# Patient Record
Sex: Male | Born: 1950 | Race: Black or African American | Hispanic: No | Marital: Married
Health system: Midwestern US, Community
[De-identification: ages and names within clinical notes are randomized; demographics above are authoritative.]

## PROBLEM LIST (undated history)

## (undated) DIAGNOSIS — I10 Essential (primary) hypertension: Secondary | ICD-10-CM

## (undated) DIAGNOSIS — R030 Elevated blood-pressure reading, without diagnosis of hypertension: Secondary | ICD-10-CM

## (undated) DIAGNOSIS — IMO0001 Reserved for inherently not codable concepts without codable children: Secondary | ICD-10-CM

## (undated) DIAGNOSIS — K635 Polyp of colon: Secondary | ICD-10-CM

## (undated) DIAGNOSIS — E785 Hyperlipidemia, unspecified: Secondary | ICD-10-CM

## (undated) DIAGNOSIS — R079 Chest pain, unspecified: Secondary | ICD-10-CM

## (undated) HISTORY — DX: Reserved for inherently not codable concepts without codable children: IMO0001

## (undated) HISTORY — DX: Chest pain, unspecified: R07.9

## (undated) HISTORY — DX: Hypercalcemia: E83.52

## (undated) HISTORY — DX: Elevated blood-pressure reading, without diagnosis of hypertension: R03.0

## (undated) HISTORY — DX: Hyperlipidemia, unspecified: E78.5

## (undated) HISTORY — DX: Polyp of colon: K63.5

## (undated) NOTE — Telephone Encounter (Signed)
 Formatting of this note might be different from the original. RX declined.  Pt relocated out of town Electronically signed by Tonnie Pao at 04/09/2022  6:35 AM EDT

---

## 2001-07-10 ENCOUNTER — Encounter: Payer: Self-pay | Admitting: Emergency Medicine

## 2001-07-10 ENCOUNTER — Emergency Department (HOSPITAL_COMMUNITY): Admission: EM | Admit: 2001-07-10 | Discharge: 2001-07-10 | Payer: Self-pay | Admitting: Emergency Medicine

## 2003-02-28 ENCOUNTER — Ambulatory Visit (HOSPITAL_COMMUNITY): Admission: RE | Admit: 2003-02-28 | Discharge: 2003-02-28 | Payer: Self-pay | Admitting: Infectious Diseases

## 2003-02-28 ENCOUNTER — Encounter: Payer: Self-pay | Admitting: Infectious Diseases

## 2008-12-07 HISTORY — PX: COLONOSCOPY: SHX174

## 2009-06-20 ENCOUNTER — Encounter (INDEPENDENT_AMBULATORY_CARE_PROVIDER_SITE_OTHER): Payer: Self-pay | Admitting: *Deleted

## 2009-07-16 ENCOUNTER — Ambulatory Visit: Payer: Self-pay | Admitting: Internal Medicine

## 2009-07-16 DIAGNOSIS — N400 Enlarged prostate without lower urinary tract symptoms: Secondary | ICD-10-CM

## 2009-07-26 ENCOUNTER — Ambulatory Visit: Payer: Self-pay | Admitting: Internal Medicine

## 2009-08-09 ENCOUNTER — Encounter (INDEPENDENT_AMBULATORY_CARE_PROVIDER_SITE_OTHER): Payer: Self-pay | Admitting: *Deleted

## 2009-08-13 LAB — CONVERTED CEMR LAB
ALT: 30 units/L (ref 0–53)
AST: 22 units/L (ref 0–37)
Albumin: 4.4 g/dL (ref 3.5–5.2)
Alkaline Phosphatase: 58 units/L (ref 39–117)
BUN: 16 mg/dL (ref 6–23)
Basophils Absolute: 0 10*3/uL (ref 0.0–0.1)
Basophils Relative: 0.5 % (ref 0.0–3.0)
Bilirubin, Direct: 0 mg/dL (ref 0.0–0.3)
CO2: 31 meq/L (ref 19–32)
Calcium: 9.5 mg/dL (ref 8.4–10.5)
Chloride: 109 meq/L (ref 96–112)
Cholesterol: 218 mg/dL — ABNORMAL HIGH (ref 0–200)
Creatinine, Ser: 1 mg/dL (ref 0.4–1.5)
Direct LDL: 164.7 mg/dL
Eosinophils Absolute: 0.1 10*3/uL (ref 0.0–0.7)
Eosinophils Relative: 1.9 % (ref 0.0–5.0)
GFR calc non Af Amer: 98.44 mL/min (ref >60)
Glucose, Bld: 94 mg/dL (ref 70–99)
HCT: 42.7 % (ref 39.0–52.0)
HDL: 43 mg/dL (ref >39.00)
Hemoglobin: 14.6 g/dL (ref 13.0–17.0)
Lymphocytes Relative: 36.3 % (ref 12.0–46.0)
Lymphs Abs: 2.4 10*3/uL (ref 0.7–4.0)
MCHC: 34.2 g/dL (ref 30.0–36.0)
MCV: 86.8 fL (ref 78.0–100.0)
Monocytes Absolute: 0.4 10*3/uL (ref 0.1–1.0)
Monocytes Relative: 6.4 % (ref 3.0–12.0)
Neutro Abs: 3.6 10*3/uL (ref 1.4–7.7)
Neutrophils Relative %: 54.9 % (ref 43.0–77.0)
PSA: 0.33 ng/mL (ref 0.10–4.00)
Platelets: 253 10*3/uL (ref 150.0–400.0)
Potassium: 3.8 meq/L (ref 3.5–5.1)
RBC: 4.92 M/uL (ref 4.22–5.81)
RDW: 12.9 % (ref 11.5–14.6)
Sodium: 144 meq/L (ref 135–145)
TSH: 1.53 microintl units/mL (ref 0.35–5.50)
Total Bilirubin: 0.8 mg/dL (ref 0.3–1.2)
Total CHOL/HDL Ratio: 5
Total Protein: 7.6 g/dL (ref 6.0–8.3)
Triglycerides: 73 mg/dL (ref 0.0–149.0)
VLDL: 14.6 mg/dL (ref 0.0–40.0)
WBC: 6.5 10*3/uL (ref 4.5–10.5)

## 2009-08-27 ENCOUNTER — Ambulatory Visit: Payer: Self-pay | Admitting: Internal Medicine

## 2009-09-10 ENCOUNTER — Ambulatory Visit: Payer: Self-pay | Admitting: Internal Medicine

## 2009-09-10 ENCOUNTER — Encounter: Payer: Self-pay | Admitting: Internal Medicine

## 2009-09-11 ENCOUNTER — Encounter: Payer: Self-pay | Admitting: Internal Medicine

## 2011-03-24 ENCOUNTER — Encounter: Payer: Self-pay | Admitting: Internal Medicine

## 2011-03-24 ENCOUNTER — Ambulatory Visit (INDEPENDENT_AMBULATORY_CARE_PROVIDER_SITE_OTHER): Payer: BLUE CROSS/BLUE SHIELD | Admitting: Internal Medicine

## 2011-03-24 VITALS — BP 116/72 | HR 60 | Temp 98.6°F | Wt 164.6 lb

## 2011-03-24 DIAGNOSIS — K4090 Unilateral inguinal hernia, without obstruction or gangrene, not specified as recurrent: Secondary | ICD-10-CM

## 2011-03-24 NOTE — Patient Instructions (Signed)
Please take this note to the Surgeon.

## 2011-03-24 NOTE — Progress Notes (Signed)
  Subjective:    Patient ID: Max Perez, male    DOB: [birth year], 60 y.o.   MRN: [id number]  HPI he noted a knot in the right inguinal area slightly over 3 weeks ago. It was not tender, swollen, or red.  He has no constitutional symptoms of fever, chills, sweats, or weight loss. He's had no abdominal  pain, dysuria , hematuria, or pyuria.  He denies any abnormal bruising or bleeding. He  has  no enlarged lymph nodes.  His job does  not involve  heavy lifting, but he is involved in karate.      Review of Systems     Objective:   Physical Exam he is healthy appearing &  well-nourished.  Abdomen reveals no organomegaly, masses, or tenderness.  He has no lymphadenopathy about the head, neck, or inguinal areas.  Skin reveals no significant lesions.  The genitalia revealed no significant lesions.  He has an inguinal  hernia on the right which is reducible.          Assessment & Plan:  #1 reducible hernia right inguinal area. He is engaged in karate which could possibly exacerbate the hernia  Plan: #1 Gen. surgery referral to review risk and options.

## 2011-03-25 ENCOUNTER — Encounter: Payer: Self-pay | Admitting: Internal Medicine

## 2011-04-15 ENCOUNTER — Encounter (HOSPITAL_COMMUNITY)
Admission: RE | Admit: 2011-04-15 | Discharge: 2011-04-15 | Disposition: A | Discharge status: Home / Self Care | Payer: BC Managed Care – PPO | Source: Ambulatory Visit | Attending: General Surgery | Admitting: General Surgery

## 2011-04-15 LAB — DIFFERENTIAL
Basophils Absolute: 0 10*3/uL (ref 0.0–0.1)
Basophils Relative: 0 % (ref 0–1)
Eosinophils Absolute: 0.1 10*3/uL (ref 0.0–0.7)
Eosinophils Relative: 1 % (ref 0–5)
Lymphocytes Relative: 29 % (ref 12–46)
Lymphs Abs: 2.4 10*3/uL (ref 0.7–4.0)
Monocytes Absolute: 0.5 10*3/uL (ref 0.1–1.0)
Monocytes Relative: 6 % (ref 3–12)
Neutro Abs: 5.1 10*3/uL (ref 1.7–7.7)
Neutrophils Relative %: 63 % (ref 43–77)

## 2011-04-15 LAB — BASIC METABOLIC PANEL
BUN: 20 mg/dL (ref 6–23)
Calcium: 10.6 mg/dL — ABNORMAL HIGH (ref 8.4–10.5)
Creatinine, Ser: 0.92 mg/dL (ref 0.4–1.5)
GFR calc non Af Amer: >60 mL/min (ref >60)
Glucose, Bld: 85 mg/dL (ref 70–99)
Sodium: 142 mEq/L (ref 135–145)

## 2011-04-15 LAB — CBC
HCT: 41.9 % (ref 39.0–52.0)
Hemoglobin: 14.6 g/dL (ref 13.0–17.0)
MCH: 29.2 pg (ref 26.0–34.0)
MCHC: 34.8 g/dL (ref 30.0–36.0)
MCV: 83.8 fL (ref 78.0–100.0)
Platelets: 267 10*3/uL (ref 150–400)
RBC: 5 MIL/uL (ref 4.22–5.81)
RDW: 12.9 % (ref 11.5–15.5)
WBC: 8.1 10*3/uL (ref 4.0–10.5)

## 2011-04-20 ENCOUNTER — Ambulatory Visit (HOSPITAL_COMMUNITY)
Admission: RE | Admit: 2011-04-20 | Discharge: 2011-04-21 | Disposition: A | Discharge status: Home / Self Care | Payer: BC Managed Care – PPO | Source: Ambulatory Visit | Attending: General Surgery | Admitting: General Surgery

## 2011-04-20 DIAGNOSIS — Z01812 Encounter for preprocedural laboratory examination: Secondary | ICD-10-CM | POA: Insufficient documentation

## 2011-04-20 DIAGNOSIS — Z01810 Encounter for preprocedural cardiovascular examination: Secondary | ICD-10-CM | POA: Insufficient documentation

## 2011-04-20 DIAGNOSIS — K4090 Unilateral inguinal hernia, without obstruction or gangrene, not specified as recurrent: Secondary | ICD-10-CM | POA: Insufficient documentation

## 2011-05-01 NOTE — Op Note (Signed)
NAME:  Max Perez, Max Perez NO.:  [id number]  MEDICAL RECORD NO.:  [id number]           PATIENT TYPE:  O  LOCATION:  5149                         FACILITY:  [facility]  PHYSICIAN:  Mary Sella. Andrey Campanile, MD     DATE OF BIRTH:  May 24, 1951  DATE OF PROCEDURE:  04/20/2011 DATE OF DISCHARGE:                              OPERATIVE REPORT   PREOPERATIVE DIAGNOSIS:  Right inguinal hernia.  POSTOPERATIVE DIAGNOSIS:  Right direct inguinal hernia.  PROCEDURE:  Laparoscopic repair of right direct inguinal hernia with mesh (TAPP).  SURGEON:  Mary Sella. Andrey Campanile, MD  ASSISTANT:  None.  ANESTHESIA:  General plus 20 mL of 0.25% Marcaine with epinephrine.  FINDINGS:  The patient only had a right direct inguinal hernia.  There is no evidence of any other groin hernia.  We used a 4 inches x 6 inches piece of Physiomesh.  INDICATIONS FOR PROCEDURE:  The patient is a 60 year old African American male who noticed a bulge in his groin beginning in April.  It was more noticeable during the day as he did his activities of daily living.  He went to his primary care doctor who diagnosed hernia.  We confirmed the diagnosis in the office.  We discussed the risks, benefits of surgery including bleeding, infection, injury to surrounding structures, testicular loss, chronic inguinal pain, DVT occurrence, urinary retention, hernia recurrence, and possible need to convert to an open procedure.  He elected to proceed to the operating room.  DESCRIPTION OF PROCEDURE:  Before going back to the operating room, the patient's right groin was marked in the holding bay with the patient confirming the operative site.  He was then taken back to the operating room where general endotracheal anesthesia was established.  A Foley catheter was placed.  Sequential compression devices were placed.  His abdomen was prepped and draped in usual standard surgical fashion.  He received antibiotics prior to skin incision.  A  surgical time-out was performed.  Local was infiltrated at the base of the umbilicus.  Next, a vertical 1-inch infraumbilical incision was made with a #11 blade.  The fascia was grasped and lifted anteriorly.  Next, the fascia was incised with a #11 blade and the abdominal cavity was entered.  A pursestring suture consisting of 0 Vicryl and UR-6 needle was placed around the fascial edges.  The Hasson trocar was introduced directly into the abdominal cavity.  Pneumoperitoneum was smoothly established up to the patient pressure of 15 mmHg.  Laparoscope was advanced, and he placed in slight Trendelenburg position.  The right groin was inspected.  He had evidence of a direct hernia since it was medial to the inferior epigastric vessels in the right.  In the left groin, there was no hernia defect at all.  I then placed two 5-mm trocars, one in the right, one in the left midclavicular line at the level of the umbilicus, all under direct visualization after local  had been infiltrated.  I then grasped the peritoneum several inches above the right anterior and superior iliac spine with the snuff-nose grasper then incised it with EndoShears with electrocautery.  I then extended the  incision medially in a lazy-S configuration towards the median umbilical ligament.  I then dissected the peritoneal flap downward away from the abdominal wall taking care not to injure the inferior epigastric vessel.  He is a thin gentleman and his anatomy was nice.  Identified the testicular vessels as well as the vas deferens.  I then continued to strip and take down the loose areolar tissues.  The pubic tubercle was identified.  Once I had a nice flap dissected downward, I then reduced the direct defect.  There is a little bit of fat in this area, and I stripped it, and used the interstitial electrocautery to separate it, and some of the fat was excised and brought out from the abdominal cavity and discarded.   We reduced the direct hernia in its entirety stripping away the peritoneum away from the direct defect.  I then continued to make my flap posteriorly down toward the iliacs taking care not to injure the iliacs or the vas or the testicular vessels.  I had a nice pocket at this point.  I reconfirmed my anatomical landmarks being the pubic tubercle, the inferior epigastric vessels, vas deferens, and testicular vessels. I then obtained a piece of 4 inches x 6 inches piece of Physiomesh, placed it through the Hasson trocar and placed it in the right groin. It is oriented and slightly oblique, so that the midpoint hash was directly over the inferior epigastric vessels.  Prior to doing this, I did grasp the peritoneum in the direct defect, and brought it back into the abdominal cavity and tacked it to [employer] with a secure strap tacker then laid the mesh back out so that happened was covering the direct defect and half covering laterally to the inferior epigastric vessels.  I then tacked the mesh in a sequential manner starting medially in Cooper ligament into the anterior abdominal wall then placing one tack on each side of the inferior gastric vessels, and one tack out laterally.  There was no tack placed below the shelving edge. We then reduced the intra-abdominal pressure down to 8 mmHg.  I then brought the peritoneal flap intact back up to the abdominal wall covering the mesh ensuring that there was no tack placed through the inferior epigastric vessel.  Again, no tack was placed below the inguinal ligament.  There was a small rent that had been made in the peritoneal flap.  I was able to puncture back together and placed the tack through it into the right lateral abdominal wall.  The mesh was well covered.  I removed this on trocar and tied down the previously placed pursestring suture.  The fascial defect was obliterated, but I felt may be 3 mm or 4 mm opening at the apex of the  umbilical incision, therefore, I placed a single interrupted 0 Vicryl and tied it down.  I was more satisfied with my fascial closure.  There was nothing within our fascial closure when viewed laparoscopically.  Pneumoperitoneum was released.  Remaining trocars were removed.  Skin incisions were closed with a 4-0 Monocryl followed by the application of Dermabond.  There are no immediate complications.  Foley catheter was removed.  The patient was extubated and taken to recovery room in stable addition.  All needle and sponge counts were correct x2.     Mary Sella. Andrey Campanile, MD     EMW/MEDQ  D:  04/20/2011  T:  04/21/2011  Job:  [id number]  cc:   Titus Dubin. Alwyn Ren, MD,FACP,FCCP  Electronically Signed by Gaynelle Adu M.D. on 05/01/2011 09:15:29 AM

## 2011-05-06 ENCOUNTER — Encounter (INDEPENDENT_AMBULATORY_CARE_PROVIDER_SITE_OTHER): Payer: Self-pay | Admitting: General Surgery

## 2014-07-31 ENCOUNTER — Encounter: Payer: Self-pay | Admitting: Internal Medicine

## 2014-09-26 ENCOUNTER — Ambulatory Visit (INDEPENDENT_AMBULATORY_CARE_PROVIDER_SITE_OTHER): Payer: BC Managed Care – PPO | Admitting: Internal Medicine

## 2014-09-26 ENCOUNTER — Encounter: Payer: Self-pay | Admitting: Internal Medicine

## 2014-09-26 VITALS — BP 156/92 | HR 58 | Temp 98.6°F | Resp 13 | Wt 179.1 lb

## 2014-09-26 DIAGNOSIS — Z0189 Encounter for other specified special examinations: Secondary | ICD-10-CM

## 2014-09-26 DIAGNOSIS — Z0000 Encounter for general adult medical examination without abnormal findings: Secondary | ICD-10-CM

## 2014-09-26 DIAGNOSIS — E785 Hyperlipidemia, unspecified: Secondary | ICD-10-CM | POA: Insufficient documentation

## 2014-09-26 DIAGNOSIS — K635 Polyp of colon: Secondary | ICD-10-CM | POA: Insufficient documentation

## 2014-09-26 DIAGNOSIS — N400 Enlarged prostate without lower urinary tract symptoms: Secondary | ICD-10-CM

## 2014-09-26 DIAGNOSIS — R030 Elevated blood-pressure reading, without diagnosis of hypertension: Secondary | ICD-10-CM

## 2014-09-26 NOTE — Progress Notes (Signed)
Pre visit review using our clinic review tool, if applicable. No additional management support is needed unless otherwise documented below in the visit note. 

## 2014-09-26 NOTE — Progress Notes (Signed)
   Subjective:    Patient ID: Max Perez, male    DOB: [birth year], [age] y.o.   MRN: [id number]  HPI  He is here for a physical;acute issues denied.  He is not on a heart healthy diet but does restrict salt. He states that he monitors his blood pressure at the pharmacy. He does not have the recordings with him  He does exercise 5 days/ week without cardiopulmonary symptoms  His last lipids were in 2010; LDL was 164.7. There is no family history of premature heart attack or stroke.  He's had history of hyperplastic polyp in 2010. He has no active GI symptoms.      Review of Systems   Chest pain, palpitations, tachycardia, exertional dyspnea, paroxysmal nocturnal dyspnea, claudication or edema are absent.  Unexplained weight loss, abdominal pain, significant dyspepsia, dysphagia, melena, rectal bleeding, or persistently small caliber stools are denied.     Objective:   Physical Exam Gen.: Healthy and well-nourished in appearance. Alert, appropriate and cooperative throughout exam. Appears younger than stated age  Head: Normocephalic without obvious abnormalities; pattern alopecia  Eyes: No corneal or conjunctival inflammation noted. Pupils equal round reactive to light and accommodation. Extraocular motion slightly decreased laterally. Ptosis. Arcus Ears: External  ear exam reveals no significant lesions or deformities. Canals clear .TMs normal. Hearing is grossly normal bilaterally. Nose: External nasal exam reveals no deformity or inflammation. Nasal mucosa are pink and moist. No lesions or exudates noted.   Mouth: Oral mucosa and oropharynx reveal no lesions or exudates. Teeth in fair repair. Neck: No deformities, masses, or tenderness noted. Range of motion & Thyroid normal Lungs: Normal respiratory effort; chest expands symmetrically. Lungs are clear to auscultation without rales, wheezes, or increased work of breathing. Heart: Normal rate and rhythm. Accentuated S1 and S2. No  gallop, click, or rub. No murmur. Abdomen: Bowel sounds normal; abdomen soft and nontender. No masses, organomegaly or hernias noted. Genitalia: Genitalia normal except for left varices. Prostate is upper limits of normal to minimally enlarged w/o asymmetry, nodularity, or induration                           Musculoskeletal/extremities: No deformity or scoliosis noted of  the thoracic or lumbar spine. No clubbing, cyanosis, edema, or significant extremity  deformity noted. Range of motion normal .Tone & strength normal. Hand joints normal Fingernail health good. Able to lie down & sit up w/o help. Negative SLR bilaterally Vascular: Carotid, radial artery, dorsalis pedis and  posterior tibial pulses are full and equal. No bruits present. Neurologic: Alert and oriented x3. Deep tendon reflexes symmetrical and normal.  Gait normal .      Skin: Intact without suspicious lesions or rashes. Lymph: No cervical, axillary, or inguinal lymphadenopathy present. Psych: Mood and affect are normal. Normally interactive                                                                                        Assessment & Plan:  #1 comprehensive physical exam; no acute findings  Plan: see Orders  & Recommendations

## 2014-09-26 NOTE — Patient Instructions (Signed)
Minimal Blood Pressure Goal= AVERAGE < 140/90;  Ideal is an AVERAGE < 135/85. This AVERAGE should be calculated from @ least 5-7 BP readings taken @ different times of day on different days of week. You should not respond to isolated BP readings , but rather the AVERAGE for that week .Please bring your  blood pressure cuff to office visits to verify that it is reliable.It  can also be checked against the blood pressure device at the pharmacy. Finger or wrist cuffs are not dependable; an arm cuff is.  Your next office appointment will be determined based upon review of your pending labs . Those instructions will be transmitted to you through  by mail

## 2014-09-27 ENCOUNTER — Other Ambulatory Visit: Payer: Self-pay | Admitting: Internal Medicine

## 2014-09-27 ENCOUNTER — Other Ambulatory Visit (INDEPENDENT_AMBULATORY_CARE_PROVIDER_SITE_OTHER): Payer: BC Managed Care – PPO

## 2014-09-27 DIAGNOSIS — Z0189 Encounter for other specified special examinations: Secondary | ICD-10-CM

## 2014-09-27 DIAGNOSIS — Z0000 Encounter for general adult medical examination without abnormal findings: Secondary | ICD-10-CM

## 2014-09-27 LAB — CBC WITH DIFFERENTIAL/PLATELET
BASOS PCT: 0.3 % (ref 0.0–3.0)
Basophils Absolute: 0 10*3/uL (ref 0.0–0.1)
EOS ABS: 0.1 10*3/uL (ref 0.0–0.7)
EOS PCT: 1.7 % (ref 0.0–5.0)
HEMATOCRIT: 44.5 % (ref 39.0–52.0)
HEMOGLOBIN: 14.9 g/dL (ref 13.0–17.0)
Lymphocytes Relative: 29.1 % (ref 12.0–46.0)
Lymphs Abs: 1.9 10*3/uL (ref 0.7–4.0)
MCHC: 33.5 g/dL (ref 30.0–36.0)
MCV: 85.1 fl (ref 78.0–100.0)
MONO ABS: 0.4 10*3/uL (ref 0.1–1.0)
Monocytes Relative: 5.9 % (ref 3.0–12.0)
NEUTROS ABS: 4.2 10*3/uL (ref 1.4–7.7)
NEUTROS PCT: 63 % (ref 43.0–77.0)
Platelets: 283 10*3/uL (ref 150.0–400.0)
RBC: 5.23 Mil/uL (ref 4.22–5.81)
RDW: 13.7 % (ref 11.5–15.5)
WBC: 6.7 10*3/uL (ref 4.0–10.5)

## 2014-09-27 LAB — BASIC METABOLIC PANEL
BUN: 19 mg/dL (ref 6–23)
CHLORIDE: 107 meq/L (ref 96–112)
CO2: 25 meq/L (ref 19–32)
Calcium: 9.7 mg/dL (ref 8.4–10.5)
Creatinine, Ser: 1.2 mg/dL (ref 0.4–1.5)
GFR: 77.66 mL/min (ref >60.00)
Glucose, Bld: 91 mg/dL (ref 70–99)
POTASSIUM: 4.3 meq/L (ref 3.5–5.1)
SODIUM: 142 meq/L (ref 135–145)

## 2014-09-27 LAB — HEPATIC FUNCTION PANEL
ALBUMIN: 3.9 g/dL (ref 3.5–5.2)
ALT: 31 U/L (ref 0–53)
AST: 25 U/L (ref 0–37)
Alkaline Phosphatase: 64 U/L (ref 39–117)
BILIRUBIN DIRECT: 0.1 mg/dL (ref 0.0–0.3)
TOTAL PROTEIN: 8 g/dL (ref 6.0–8.3)
Total Bilirubin: 0.7 mg/dL (ref 0.2–1.2)

## 2014-09-27 LAB — TSH: TSH: 0.93 u[IU]/mL (ref 0.35–4.50)

## 2014-09-27 LAB — PSA: PSA: 0.38 ng/mL (ref 0.10–4.00)

## 2014-09-30 ENCOUNTER — Encounter: Payer: Self-pay | Admitting: Internal Medicine

## 2014-10-01 LAB — NMR LIPOPROFILE WITH LIPIDS
CHOLESTEROL, TOTAL: 199 mg/dL (ref 100–199)
HDL Particle Number: 31.1 umol/L (ref >=30.5)
HDL SIZE: 8.4 nm — AB (ref >=9.2)
HDL-C: 45 mg/dL (ref >39)
LARGE HDL: 3.6 umol/L — AB (ref >=4.8)
LARGE VLDL-P: 2 nmol/L (ref <=2.7)
LDL (calc): 141 mg/dL — ABNORMAL HIGH (ref 0–99)
LDL Particle Number: 1754 nmol/L — ABNORMAL HIGH (ref <1000)
LDL Size: 20.6 nm (ref >=20.8)
LP-IR Score: 80 — ABNORMAL HIGH (ref <=45)
SMALL LDL PARTICLE NUMBER: 812 nmol/L — AB (ref <=527)
TRIGLYCERIDES: 63 mg/dL (ref 0–149)
VLDL Size: 62.6 nm — ABNORMAL HIGH (ref <=46.6)

## 2016-01-27 ENCOUNTER — Encounter

## 2016-01-27 ENCOUNTER — Emergency Department: Admit: 2016-01-27

## 2016-01-27 ENCOUNTER — Inpatient Hospital Stay: Admit: 2016-01-27 | Discharge: 2016-01-27 | Disposition: A | Discharge status: Home / Self Care

## 2016-01-27 DIAGNOSIS — S6992XA Unspecified injury of left wrist, hand and finger(s), initial encounter: Secondary | ICD-10-CM

## 2016-01-27 MED ORDER — HYDROCODONE-ACETAMINOPHEN 5-325 MG PO TABS
5-325 MG | ORAL_TABLET | Freq: Three times a day (TID) | ORAL | 0 refills | Status: AC | PRN
Start: 2016-01-27 — End: ?

## 2016-01-27 MED ORDER — HYDROCODONE-ACETAMINOPHEN 5-325 MG PO TABS
5-325 MG | Freq: Once | ORAL | Status: AC
Start: 2016-01-27 — End: 2016-01-27
  Administered 2016-01-27: 20:00:00 1 via ORAL

## 2016-01-27 MED FILL — HYDROCODONE-ACETAMINOPHEN 5-325 MG PO TABS: 5-325 MG | ORAL | Qty: 1

## 2016-01-27 NOTE — ED Notes (Signed)
Pt left wrist cleansed with Hibiclens/Normal Saline applied with gauze/kling/4in strapping ace wrap.     Karie Soda, LPN  11/91/47 [phone]

## 2016-01-27 NOTE — ED Notes (Signed)
Pt instructed on how to use Incentive Spirometer. Pt verbalized understanding.     Karie Soda, LPN  19/14/78 2956
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
## 2016-01-27 NOTE — ED Notes (Signed)
Pt scripts x1 instructed to follow up with [facility]. Assessed per Kendell Bane NP.     Karie Soda, LPN  16/10/96 0454

## 2018-05-25 LAB — HM COLONOSCOPY

## 2019-04-02 ENCOUNTER — Encounter: Payer: Self-pay | Admitting: Internal Medicine

## 2021-07-08 ENCOUNTER — Emergency Department (HOSPITAL_COMMUNITY): Payer: Medicare PPO

## 2021-07-08 ENCOUNTER — Emergency Department (HOSPITAL_COMMUNITY)
Admission: EM | Admit: 2021-07-08 | Discharge: 2021-07-08 | Disposition: A | Discharge status: Home / Self Care | Payer: Medicare PPO | Attending: Emergency Medicine | Admitting: Emergency Medicine

## 2021-07-08 ENCOUNTER — Encounter (HOSPITAL_COMMUNITY): Payer: Self-pay | Admitting: Emergency Medicine

## 2021-07-08 DIAGNOSIS — R079 Chest pain, unspecified: Secondary | ICD-10-CM | POA: Diagnosis present

## 2021-07-08 DIAGNOSIS — R0789 Other chest pain: Secondary | ICD-10-CM | POA: Diagnosis not present

## 2021-07-08 DIAGNOSIS — Z79899 Other long term (current) drug therapy: Secondary | ICD-10-CM | POA: Diagnosis not present

## 2021-07-08 DIAGNOSIS — I10 Essential (primary) hypertension: Secondary | ICD-10-CM | POA: Diagnosis not present

## 2021-07-08 LAB — BASIC METABOLIC PANEL
Anion gap: 7 (ref 5–15)
BUN: 18 mg/dL (ref 8–23)
CO2: 27 mmol/L (ref 22–32)
Calcium: 10 mg/dL (ref 8.9–10.3)
Chloride: 107 mmol/L (ref 98–111)
Creatinine, Ser: 1.24 mg/dL (ref 0.61–1.24)
GFR, Estimated: >60 mL/min (ref >60)
Glucose, Bld: 94 mg/dL (ref 70–99)
Potassium: 4.1 mmol/L (ref 3.5–5.1)
Sodium: 141 mmol/L (ref 135–145)

## 2021-07-08 LAB — TROPONIN I (HIGH SENSITIVITY)
Troponin I (High Sensitivity): 6 ng/L (ref <18)
Troponin I (High Sensitivity): 5 ng/L (ref <18)

## 2021-07-08 LAB — CBC
HCT: 44 % (ref 39.0–52.0)
Hemoglobin: 14.6 g/dL (ref 13.0–17.0)
MCH: 29.3 pg (ref 26.0–34.0)
MCHC: 33.2 g/dL (ref 30.0–36.0)
MCV: 88.2 fL (ref 80.0–100.0)
Platelets: 266 10*3/uL (ref 150–400)
RBC: 4.99 MIL/uL (ref 4.22–5.81)
RDW: 13.1 % (ref 11.5–15.5)
WBC: 5.4 10*3/uL (ref 4.0–10.5)
nRBC: 0 % (ref 0.0–0.2)

## 2021-07-08 MED ORDER — IOHEXOL 350 MG/ML SOLN
75.0000 mL | Freq: Once | INTRAVENOUS | Status: AC | PRN
  Administered 2021-07-08: 75 mL via INTRAVENOUS

## 2021-07-08 NOTE — ED Provider Notes (Signed)
[facility] EMERGENCY DEPARTMENT Provider Note   CSN: [id number] Arrival date & time: 07/08/21  3491     History CC: Left chest pain   Max Perez is a 70 y.o. male presented emergency department with left-sided chest pain.  The patient reports that he was diagnosed with COVID on 18 July.  He had cough, congestion, mild symptoms overall with this.  He felt he had recovered.  However for the past week he has noted sharp left-sided chest pain.  This is located near his left breast.  It radiates towards his left side, not his back.  He notices it in the morning when he wakes up.  He has noticed it for the past 7 days.  It tends to go away after he is up and moving about.  It is not worse with inspiration or any particular movements.  He has never had this pain before.  He reports he is currently asymptomatic.  His last episode of chest pain was yesterday.  He denies exertional symptoms.  He reports a history of hypertension hyperlipidemia and takes medications for this.  He denies history of diabetes, smoking, personal history of MI or cardiac disease.  He does report a family history of MI in his sister at a younger age.  Denies any personal family history of aneurysms.  He is here with his wife at bedside.  No hemoptysis or asymmetric LE edema. Patient denies personal or family history of DVT or PE. No recent hormone use (including OCP); travel for >6 hours; prolonged immobilization for greater than 3 days; surgeries or trauma in the last 4 weeks; or malignancy with treatment within 6 months.   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 Authorizing Provider  cholecalciferol (VITAMIN D3) 25 MCG (1000 UNIT) tablet Take 1,000 Units by mouth daily. Take 1 tablet by mouth once daily   Yes [provider]  losartan (COZAAR) 100 MG tablet Take 100 mg by mouth daily. Take 1 tablet by mouth once daily 04/21/21   [provider]  pravastatin (PRAVACHOL) 40 MG tablet Take 40 mg by mouth daily. Take 1 tablet by mouth once daily 01/27/21   [provider]    Allergies    Patient has no known allergies.  Review of Systems   Review of Systems  Constitutional:  Negative for chills and fever.  HENT:  Negative for ear pain and sore throat.   Eyes:  Negative for pain and visual disturbance.  Respiratory:  Negative for  cough and shortness of breath.   Cardiovascular:  Positive for chest pain. Negative for palpitations.  Gastrointestinal:  Negative for abdominal pain and vomiting.  Genitourinary:  Negative for dysuria and hematuria.  Musculoskeletal:  Negative for arthralgias and back pain.  Skin:  Negative for color change and rash.  Neurological:  Negative for syncope and headaches.  All other systems reviewed and are negative.  Physical Exam Updated Vital Signs BP (!) 142/97   Pulse 62   Temp 98.5 F (36.9 C)   Resp 17   SpO2 100%   Physical Exam Constitutional:      General: He is not in acute distress. HENT:     Head: Normocephalic and atraumatic.  Eyes:     Conjunctiva/sclera: Conjunctivae normal.     Pupils: Pupils are equal, round, and reactive to light.  Cardiovascular:      Rate and Rhythm: Normal rate and regular rhythm.  Pulmonary:     Effort: Pulmonary effort is normal. No respiratory distress.  Abdominal:     General: There is no distension.     Tenderness: There is no abdominal tenderness.  Musculoskeletal:        General: No tenderness.  Skin:    General: Skin is warm and dry.  Neurological:     General: No focal deficit present.     Mental Status: He is alert. Mental status is at baseline.  Psychiatric:        Mood and Affect: Mood normal.        Behavior: Behavior normal.    ED Results / Procedures / Treatments   Labs (all labs ordered are listed, but only abnormal results are displayed) Labs Reviewed  BASIC METABOLIC PANEL  CBC  TROPONIN I (HIGH SENSITIVITY)  TROPONIN I (HIGH SENSITIVITY)    EKG EKG Interpretation  Date/Time:  Tuesday July 08 2021 07:34:43 EDT Ventricular Rate:  62 PR Interval:  200 QRS Duration: 98 QT Interval:  366 QTC Calculation: 371 R Axis:   45 Text Interpretation: Normal sinus rhythm Normal ECG Confirmed by Alvester Chou [phone]) on 07/08/2021 11:58:50 AM  Radiology DG Chest 2 View  Result Date: 07/08/2021 CLINICAL DATA:  Chest pain EXAM: CHEST - 2 VIEW COMPARISON:  Chest x-ray dated February 28, 2003 FINDINGS: Normal heart size. Tortuous thoracic aorta. Lungs are clear. No acute pleural abnormalities. IMPRESSION: Tortuous thoracic aorta, possibly age related, although dilation of the ascending thoracic aorta could have a similar appearance. This could be further evaluated with CTA of the chest. Electronically Signed   By: Allegra Lai MD   On: 07/08/2021 08:17   CT ANGIO CHEST AORTA W/CM & OR WO/CM  Result Date: 07/08/2021 CLINICAL DATA:  Chest pain or back pain. Aortic dissection suspected. EXAM: CT ANGIOGRAPHY CHEST WITH CONTRAST TECHNIQUE: Multidetector CT imaging of the chest was performed using the standard protocol during bolus administration of intravenous contrast. Multiplanar CT image  reconstructions and MIPs were obtained to evaluate the vascular anatomy. CONTRAST:  42mL OMNIPAQUE IOHEXOL 350 MG/ML SOLN COMPARISON:  Chest radiography same day FINDINGS: Cardiovascular: Heart size is normal. Coronary artery calcification is present. There is no aortic atherosclerotic calcification. No aneurysm or dissection. No evidence of soft plaque. Branching pattern of the brachiocephalic vessels is normal. Mediastinum/Nodes: Normal Lungs/Pleura: The lungs are clear. No infiltrate, collapse, mass or nodule. No pleural effusion. No emphysema. Upper Abdomen: Normal Musculoskeletal: Ordinary thoracic osteophytes. Review of the MIP images confirms the above findings. IMPRESSION: No aortic dissection or other acute vascular finding.  Coronary artery calcification is present. The lungs are clear. Electronically Signed   By: Paulina Fusi M.D.   On: 07/08/2021 16:02    Procedures Procedures   Medications Ordered in ED Medications  iohexol (OMNIPAQUE) 350 MG/ML injection 75 mL (75 mLs Intravenous Contrast Given 07/08/21 1539)    ED Course  I have reviewed the triage vital signs and the nursing notes.  Pertinent labs & imaging results that were available during my care of the patient were reviewed by me and considered in my medical decision making (see chart for details).  This patient presents to the Emergency Department with complaint of chest pain. This involves an extensive number of treatment options, and is a complaint that carries with it a high risk of complications and morbidity.  The differential diagnosis includes ACS vs Pneumothorax vs Reflux/Gastritis vs MSK pain vs Pneumonia vs other.  I felt PE was less likely given that he has no tachycardia, no tachypnea, no acute risk factors for PE.  Also the symptoms are transient which makes this less likely.  I reviewed his x-ray from intake, which showed a widened thoracic aortic arch.  He has no known history of aneurysm.  However with the onset  of this chest pain, I think it is reasonable to obtain a CTA of the chest.  His EKG shows a normal sinus rhythm.  His troponins are low and flat.  Very low suspicion for ACS.  He has a heart score of 3 based on age and 2 risk factors, and therefore may benefit from an outpatient cardiac evaluation in the office if his workup is negative today.  However, overall I think that his chest pain is quite atypical for ACS.  CBC and BMP are otherwise unremarkable.   After the interventions stated above, I reevaluated the patient and found that they remained clinically stable.  Based on the patient's clinical exam, vital signs, risk factors, and ED testing, I felt that the patient's overall risk of life-threatening emergency such as ACS, PE, sepsis, or infection was low.  At this time, I felt the patient's presentation was most clinically consistent with atypical chest pain, but explained to the patient that this evaluation was not a definitive diagnostic workup.  I discussed outpatient follow up with primary care provider, and provided specialist office number on the patient's discharge paper if a referral was deemed necessary.  Return precautions were discussed with the patient.  I felt the patient was clinically stable for discharge.  Clinical Course as of 07/08/21 1738  Tue Jul 08, 2021  1605  IMPRESSION: No aortic dissection or other acute vascular finding.  Coronary artery calcification is present.  The lungs are clear. [MT]  1605 He remains pain-free.  Okay for discharge. [MT]    Clinical Course User Index [MT] Renaye Rakers Kermit Balo, MD    Final Clinical Impression(s) / ED Diagnoses Final diagnoses:  Chest pain, unspecified type    Rx / DC Orders ED Discharge Orders          Ordered    Ambulatory referral to Cardiology        07/08/21 1609             Terald Sleeper, MD 07/08/21 1739

## 2021-07-08 NOTE — ED Notes (Signed)
ED Provider at bedside. 

## 2021-07-08 NOTE — Discharge Instructions (Addendum)
You were diagnosed with chest pain today.  This is a non-specific diagnosis, but your provider did not feel this was a life-threatening condition at this time.  Chest pain is a common presenting condition in the Emergency Department, and it is not uncommon for patients to leave without a specific diagnosis.  It is important to remember that your workup today was not a complete medical workup.  You may still have a serious medical attention that needs follow up care with a specialist. If your provider referred you to see a specialist, it is VERY important that you call to set up an appointment with them.    It is also important that you speak to your primary care provider in 1-2 days after this visit to the ER.  Your PCP may want to see you in the office, or else they may want you to follow up with the specialist.  SEEK IMMEDIATE MEDICAL ATTENTION IF:  You have severe chest pain, especially if the pain is crushing or pressure-like and spreads to the arms, back, neck, or jaw, or if you have sweating, nausea (feeling sick to your stomach), or shortness of breath. THIS IS AN EMERGENCY. Don't wait to see if the pain will go away. Get medical help at once. Call 911 or 0 (operator). DO NOT drive yourself to the hospital.   Your chest pain gets worse and does not go away with rest.  You have an attack of chest pain lasting longer than usual, despite rest and treatment with the medications your caregiver has prescribed.  You wake from sleep with chest pain or shortness of breath.  You feel dizzy or faint.  You have chest pain not typical of your usual pain for which you originally saw your caregiver.  

## 2021-07-08 NOTE — ED Notes (Signed)
Pt ambulatory at d/c with spouse. VSS. GCS 15.

## 2021-07-08 NOTE — ED Triage Notes (Signed)
Pt here from home with c/o cp over the last few days , no pain aththis time, pt is 8 days post covid

## 2021-07-29 ENCOUNTER — Ambulatory Visit: Payer: Medicare PPO | Admitting: Cardiology

## 2021-07-29 ENCOUNTER — Ambulatory Visit: Payer: Medicare PPO | Admitting: Cardiovascular Disease

## 2021-10-05 ENCOUNTER — Encounter: Payer: Self-pay | Admitting: Cardiovascular Disease

## 2021-10-05 NOTE — Progress Notes (Signed)
Cardiology Office Note:    Date:  10/06/2021   ID:  Max Perez, DOB Apr 16, 1951, MRN [id number]  PCP:  Burnice Logan, PA   [facility] Providers Cardiologist:  Eugenia Pancoast to update primary MD,subspecialty MD or APP then   Referring MD: Terald Sleeper, MD   Chief Complaint  Patient presents with   Chest Pain    History of Present Illness:    Max Perez is a 70 y.o. male with a hx of chest pain, HTN, HLD, HTN. We were asked to see him for an episode of CP by Dr. Renaye Rakers  3 months ago Sharp pain ,  lasted a few seconds Not associated with any palpitations, lightheadedness  He was previously exercising every day.  Has not been exercising since that time because of concern about this chest pain. Not associated with deep breath, no assoc with twisting or turing his torso He recalls having this pain on 2 different occasions.  Went to the ER  CT showed some coronary artery calcification  No aortic dissection .    Past Medical History:  Diagnosis Date   Chest pain    Elevated blood pressure reading    Elevated BP    Hypercalcemia    Hyperlipidemia    Hyperplastic colonic polyp     Past Surgical History:  Procedure Laterality Date   COLONOSCOPY  2010   hyperplastic polyp    Current Medications: Current Meds  Medication Sig   cholecalciferol (VITAMIN D3) 25 MCG (1000 UNIT) tablet Take 1,000 Units by mouth daily. Take 1 tablet by mouth once daily   losartan (COZAAR) 100 MG tablet Take 100 mg by mouth daily. Take 1 tablet by mouth once daily   metoprolol tartrate (LOPRESSOR) 25 MG tablet Take 1 tablet (25 mg total) by mouth once for 1 dose.   pravastatin (PRAVACHOL) 40 MG tablet Take 40 mg by mouth daily. Take 1 tablet by mouth once daily   sildenafil (VIAGRA) 100 MG tablet Take 100 mg by mouth daily as needed for erectile dysfunction.     Allergies:   Patient has no known allergies.   Social History   Socioeconomic History   Marital status:  Single    Spouse name: Not on file   Number of children: Not on file   Years of education: Not on file   Highest education level: Not on file  Occupational History   Not on file  Tobacco Use   Smoking status: Never   Smokeless tobacco: Never  Substance and Sexual Activity   Alcohol use: No   Drug use: No   Sexual activity: Not on file  Other Topics Concern   Not on file  Social History Narrative   Not on file   Social Determinants of Health   Financial Resource Strain: Not on file  Food Insecurity: Not on file  Transportation Needs: Not on file  Physical Activity: Not on file  Stress: Not on file  Social Connections: Not on file     Family History: The patient's family history includes Heart disease in his sister; Lung cancer in his sister; Stroke (age of onset: 80) in his father. There is no history of Diabetes or Heart attack.  ROS:   Please see the history of present illness.     All other systems reviewed and are negative.  EKGs/Labs/Other Studies Reviewed:    The following studies were reviewed today:   EKG: October 06, 2021: Sinus bradycardia at 58.  No ST  or T wave changes.  Recent Labs: 07/08/2021: BUN 18; Creatinine, Ser 1.24; Hemoglobin 14.6; Platelets 266; Potassium 4.1; Sodium 141  Recent Lipid Panel    Component Value Date/Time   CHOL 199 09/27/2014 1027   TRIG 63 09/27/2014 1027   HDL 45 09/27/2014 1027   CHOLHDL 5 07/26/2009 1004   VLDL 14.6 07/26/2009 1004   LDLCALC 141 (H) 09/27/2014 1027   LDLDIRECT 164.7 07/26/2009 1004     Risk Assessment/Calculations:           Physical Exam:    VS:  BP 128/68 (BP Location: Left Arm, Patient Position: Sitting, Cuff Size: Large)   Pulse (!) 58   Ht 5\' 8"  (1.727 m)   Wt 177 lb 6.4 oz (80.5 kg)   SpO2 98%   BMI 26.97 kg/m     Wt Readings from Last 3 Encounters:  10/06/21 177 lb 6.4 oz (80.5 kg)  09/26/14 179 lb 2 oz (81.3 kg)  03/24/11 164 lb 9.6 oz (74.7 kg)     GEN:  Well nourished, well  developed in no acute distress HEENT: Normal NECK: No JVD; No carotid bruits LYMPHATICS: No lymphadenopathy CARDIAC: RRR, no murmurs, rubs, gallops RESPIRATORY:  Clear to auscultation without rales, wheezing or rhonchi  ABDOMEN: Soft, non-tender, non-distended MUSCULOSKELETAL:  No edema; No deformity  SKIN: Warm and dry NEUROLOGIC:  Alert and oriented x 3 PSYCHIATRIC:  Normal affect   ASSESSMENT:    1. Chest pain, unspecified type   2. Hyperlipidemia, unspecified hyperlipidemia type   3. Hypertension, unspecified type    PLAN:       Chest discomfort: Max Perez presents for further evaluation of chest discomfort.  Episode occurred at rest lasted versus off and on for several weeks.  He has a family history of some cardiac disease and I am concerned that he may have coronary artery disease himself.  Does have known coronary artery calcifications by CT scanning. We will schedule him for a coronary CT angiogram for further evaluation.  I would also like for him to send me his most recent lipid levels.  He has his lipid levels checked at [facility].  We will schedule him an appointment in 6 months.  If his CT angiogram looks okay and if his lipids are okay then I think we can probably just see him on an as-needed basis but will wait until we get the results of these 2 things.     Medication Adjustments/Labs and Tests Ordered: Current medicines are reviewed at length with the patient today.  Concerns regarding medicines are outlined above.  Orders Placed This Encounter  Procedures   CT CORONARY MORPH W/CTA COR W/SCORE W/CA W/CM &/OR WO/CM   Basic metabolic panel   EKG 12-Lead   Meds ordered this encounter  Medications   metoprolol tartrate (LOPRESSOR) 25 MG tablet    Sig: Take 1 tablet (25 mg total) by mouth once for 1 dose.    Dispense:  1 tablet    Refill:  0    Patient Instructions  Medication Instructions:  Your physician recommends that you continue on your current  medications as directed. Please refer to the Current Medication list given to you today.  *If you need a refill on your cardiac medications before your next appointment, please call your pharmacy*   Lab Work: TODAY: BMET If you have labs (blood work) drawn today and your tests are completely normal, you will receive your results only by: MyChart Message (if you have MyChart) OR  A paper copy in the mail If you have any lab test that is abnormal or we need to change your treatment, we will call you to review the results.   Testing/Procedures:   Your cardiac CT will be scheduled at one of the below locations:   [facility] [address] [place], [place] [address] [phone]  If scheduled at [facility], please arrive at the [facility] main entrance (entrance A) of [facility] 30 minutes prior to test start time. You can use the FREE valet parking offered at the main entrance (encouraged to control the heart rate for the test) Proceed to the [facility] Radiology Department (first floor) to check-in and test prep.   Please follow these instructions carefully (unless otherwise directed):  Hold all erectile dysfunction medications at least 3 days (72 hrs) prior to test.  On the Night Before the Test: Be sure to Drink plenty of water. Do not consume any caffeinated/decaffeinated beverages or chocolate 12 hours prior to your test. Do not take any antihistamines 12 hours prior to your test.   On the Day of the Test: Drink plenty of water until 1 hour prior to the test. Do not eat any food 4 hours prior to the test. You may take your regular medications prior to the test.  Take metoprolol (Lopressor) 25 MG two hours prior to test. HOLD Furosemide/Hydrochlorothiazide morning of the test.       After the Test: Drink plenty of water. After receiving IV contrast, you may experience a mild flushed feeling. This is normal. On occasion, you may  experience a mild rash up to 24 hours after the test. This is not dangerous. If this occurs, you can take Benadryl 25 mg and increase your fluid intake. If you experience trouble breathing, this can be serious. If it is severe call 911 IMMEDIATELY. If it is mild, please call our office. If you take any of these medications: Glipizide/Metformin, Avandament, Glucavance, please do not take 48 hours after completing test unless otherwise instructed.  Please allow 2-4 weeks for scheduling of routine cardiac CTs. Some insurance companies require a pre-authorization which may delay scheduling of this test.   For non-scheduling related questions, please contact the cardiac imaging nurse navigator should you have any questions/concerns: Rockwell Alexandria, Cardiac Imaging Nurse Navigator Larey Brick, Cardiac Imaging Nurse Navigator [facility] Direct Office Dial: [phone]   For scheduling needs, including cancellations and rescheduling, please call [place], [phone].    Follow-Up: At [facility], you and your health needs are our priority.  As part of our continuing mission to provide you with exceptional heart care, we have created designated Provider Care Teams.  These Care Teams include your primary Cardiologist (physician) and Advanced Practice Providers (APPs -  Physician Assistants and Nurse Practitioners) who all work together to provide you with the care you need, when you need it.  We recommend signing up for the patient portal called "MyChart".  Sign up information is provided on this After Visit Summary.  MyChart is used to connect with patients for Virtual Visits (Telemedicine).  Patients are able to view lab/test results, encounter notes, upcoming appointments, etc.  Non-urgent messages can be sent to your provider as well.   To learn more about what you can do with MyChart, go to [email or web address].    Your next appointment:   6 month(s)  The  format for your next appointment:   In Person  Provider:  Kristeen Miss, MD    Signed, Kristeen Miss, MD  10/06/2021 9:08 AM    [facility]

## 2021-10-06 ENCOUNTER — Encounter: Payer: Self-pay | Admitting: Cardiovascular Disease

## 2021-10-06 ENCOUNTER — Ambulatory Visit (INDEPENDENT_AMBULATORY_CARE_PROVIDER_SITE_OTHER): Payer: Medicare PPO | Admitting: Cardiovascular Disease

## 2021-10-06 ENCOUNTER — Other Ambulatory Visit: Payer: Self-pay

## 2021-10-06 VITALS — BP 128/68 | HR 58 | Ht 68.0 in | Wt 177.4 lb

## 2021-10-06 DIAGNOSIS — R079 Chest pain, unspecified: Secondary | ICD-10-CM

## 2021-10-06 DIAGNOSIS — E785 Hyperlipidemia, unspecified: Secondary | ICD-10-CM

## 2021-10-06 DIAGNOSIS — I10 Essential (primary) hypertension: Secondary | ICD-10-CM

## 2021-10-06 LAB — BASIC METABOLIC PANEL
BUN/Creatinine Ratio: 16 (ref 10–24)
BUN: 17 mg/dL (ref 8–27)
CO2: 24 mmol/L (ref 20–29)
Calcium: 9.9 mg/dL (ref 8.6–10.2)
Chloride: 106 mmol/L (ref 96–106)
Creatinine, Ser: 1.07 mg/dL (ref 0.76–1.27)
Glucose: 88 mg/dL (ref 70–99)
Potassium: 4.1 mmol/L (ref 3.5–5.2)
Sodium: 143 mmol/L (ref 134–144)
eGFR: 75 mL/min/{1.73_m2} (ref >59)

## 2021-10-06 MED ORDER — METOPROLOL TARTRATE 25 MG PO TABS: 25.0000 mg | ORAL_TABLET | Freq: Once | ORAL | 0 refills | Status: DC

## 2021-10-06 NOTE — Patient Instructions (Signed)
Medication Instructions:  Your physician recommends that you continue on your current medications as directed. Please refer to the Current Medication list given to you today.  *If you need a refill on your cardiac medications before your next appointment, please call your pharmacy*   Lab Work: TODAY: BMET If you have labs (blood work) drawn today and your tests are completely normal, you will receive your results only by: MyChart Message (if you have MyChart) OR A paper copy in the mail If you have any lab test that is abnormal or we need to change your treatment, we will call you to review the results.   Testing/Procedures:   Your cardiac CT will be scheduled at one of the below locations:   [facility] [address] [place], [place] [address] [phone]  If scheduled at [facility], please arrive at the [facility] main entrance (entrance A) of [facility] 30 minutes prior to test start time. You can use the FREE valet parking offered at the main entrance (encouraged to control the heart rate for the test) Proceed to the [facility] Radiology Department (first floor) to check-in and test prep.   Please follow these instructions carefully (unless otherwise directed):  Hold all erectile dysfunction medications at least 3 days (72 hrs) prior to test.  On the Night Before the Test: Be sure to Drink plenty of water. Do not consume any caffeinated/decaffeinated beverages or chocolate 12 hours prior to your test. Do not take any antihistamines 12 hours prior to your test.   On the Day of the Test: Drink plenty of water until 1 hour prior to the test. Do not eat any food 4 hours prior to the test. You may take your regular medications prior to the test.  Take metoprolol (Lopressor) 25 MG two hours prior to test. HOLD Furosemide/Hydrochlorothiazide morning of the test.       After the Test: Drink plenty of water. After receiving IV contrast,  you may experience a mild flushed feeling. This is normal. On occasion, you may experience a mild rash up to 24 hours after the test. This is not dangerous. If this occurs, you can take Benadryl 25 mg and increase your fluid intake. If you experience trouble breathing, this can be serious. If it is severe call 911 IMMEDIATELY. If it is mild, please call our office. If you take any of these medications: Glipizide/Metformin, Avandament, Glucavance, please do not take 48 hours after completing test unless otherwise instructed.  Please allow 2-4 weeks for scheduling of routine cardiac CTs. Some insurance companies require a pre-authorization which may delay scheduling of this test.   For non-scheduling related questions, please contact the cardiac imaging nurse navigator should you have any questions/concerns: Rockwell Alexandria, Cardiac Imaging Nurse Navigator Larey Brick, Cardiac Imaging Nurse Navigator [facility] Direct Office Dial: [phone]   For scheduling needs, including cancellations and rescheduling, please call [place], [phone].    Follow-Up: At [facility], you and your health needs are our priority.  As part of our continuing mission to provide you with exceptional heart care, we have created designated Provider Care Teams.  These Care Teams include your primary Cardiologist (physician) and Advanced Practice Providers (APPs -  Physician Assistants and Nurse Practitioners) who all work together to provide you with the care you need, when you need it.  We recommend signing up for the patient portal called "MyChart".  Sign up information is provided on this After  Visit Summary.  MyChart is used to connect with patients for Virtual Visits (Telemedicine).  Patients are able to view lab/test results, encounter notes, upcoming appointments, etc.  Non-urgent messages can be sent to your provider as well.   To learn more about what you can do with MyChart, go  to [email or web address].    Your next appointment:   6 month(s)  The format for your next appointment:   In Person  Provider:   Kristeen Miss, MD

## 2021-10-07 DIAGNOSIS — I2699 Other pulmonary embolism without acute cor pulmonale: Secondary | ICD-10-CM

## 2021-10-07 HISTORY — DX: Other pulmonary embolism without acute cor pulmonale: I26.99

## 2021-10-10 ENCOUNTER — Telehealth (HOSPITAL_COMMUNITY): Payer: Self-pay | Admitting: Emergency Medicine

## 2021-10-10 ENCOUNTER — Telehealth (HOSPITAL_COMMUNITY): Payer: Self-pay | Admitting: *Deleted

## 2021-10-10 NOTE — Telephone Encounter (Signed)
Reaching out to patient to offer assistance regarding upcoming cardiac imaging study; pt verbalizes understanding of appt date/time, parking situation and where to check in, pre-test NPO status and medications ordered, and verified current allergies; name and call back number provided for further questions should they arise Rockwell Alexandria RN Navigator Cardiac Imaging [facility] [phone] office [phone] cell  Pt with slow HR in OV, asked not to take metoprolol PTA Holding viagra 2 days prior to scan

## 2021-10-10 NOTE — Telephone Encounter (Signed)
Attempted to call patient regarding upcoming cardiac CT appointment. °Left message on voicemail with name and callback number ° °Ema Hebner RN Navigator Cardiac Imaging °[facility] °[phone] Office °[phone] Cell ° °

## 2021-10-14 ENCOUNTER — Other Ambulatory Visit: Payer: Self-pay

## 2021-10-14 ENCOUNTER — Encounter (HOSPITAL_COMMUNITY): Payer: Self-pay | Admitting: Emergency Medicine

## 2021-10-14 ENCOUNTER — Telehealth: Payer: Self-pay | Admitting: Cardiovascular Disease

## 2021-10-14 ENCOUNTER — Emergency Department (HOSPITAL_COMMUNITY)
Admission: EM | Admit: 2021-10-14 | Discharge: 2021-10-14 | Disposition: A | Discharge status: Home / Self Care | Payer: Medicare PPO | Attending: Emergency Medicine | Admitting: Emergency Medicine

## 2021-10-14 ENCOUNTER — Emergency Department (HOSPITAL_COMMUNITY)
Admission: RE | Admit: 2021-10-14 | Discharge: 2021-10-14 | Disposition: A | Discharge status: Home / Self Care | Payer: Medicare PPO | Source: Ambulatory Visit | Attending: Cardiovascular Disease | Admitting: Cardiovascular Disease

## 2021-10-14 DIAGNOSIS — R079 Chest pain, unspecified: Secondary | ICD-10-CM | POA: Insufficient documentation

## 2021-10-14 DIAGNOSIS — Z7901 Long term (current) use of anticoagulants: Secondary | ICD-10-CM | POA: Insufficient documentation

## 2021-10-14 DIAGNOSIS — I2699 Other pulmonary embolism without acute cor pulmonale: Secondary | ICD-10-CM | POA: Diagnosis present

## 2021-10-14 DIAGNOSIS — I2693 Single subsegmental pulmonary embolism without acute cor pulmonale: Secondary | ICD-10-CM | POA: Diagnosis not present

## 2021-10-14 LAB — COMPREHENSIVE METABOLIC PANEL
ALT: 24 U/L (ref 0–44)
AST: 22 U/L (ref 15–41)
Albumin: 4.2 g/dL (ref 3.5–5.0)
Alkaline Phosphatase: 49 U/L (ref 38–126)
Anion gap: 9 (ref 5–15)
BUN: 17 mg/dL (ref 8–23)
CO2: 26 mmol/L (ref 22–32)
Calcium: 9.7 mg/dL (ref 8.9–10.3)
Chloride: 104 mmol/L (ref 98–111)
Creatinine, Ser: 1.27 mg/dL — ABNORMAL HIGH (ref 0.61–1.24)
GFR, Estimated: >60 mL/min (ref >60)
Glucose, Bld: 110 mg/dL — ABNORMAL HIGH (ref 70–99)
Potassium: 4 mmol/L (ref 3.5–5.1)
Sodium: 139 mmol/L (ref 135–145)
Total Bilirubin: 0.9 mg/dL (ref 0.3–1.2)
Total Protein: 7.4 g/dL (ref 6.5–8.1)

## 2021-10-14 LAB — CBC WITH DIFFERENTIAL/PLATELET
Abs Immature Granulocytes: 0.02 10*3/uL (ref 0.00–0.07)
Basophils Absolute: 0.1 10*3/uL (ref 0.0–0.1)
Basophils Relative: 1 %
Eosinophils Absolute: 0.1 10*3/uL (ref 0.0–0.5)
Eosinophils Relative: 2 %
HCT: 43.2 % (ref 39.0–52.0)
Hemoglobin: 14.5 g/dL (ref 13.0–17.0)
Immature Granulocytes: 0 %
Lymphocytes Relative: 33 %
Lymphs Abs: 2.5 10*3/uL (ref 0.7–4.0)
MCH: 29.3 pg (ref 26.0–34.0)
MCHC: 33.6 g/dL (ref 30.0–36.0)
MCV: 87.3 fL (ref 80.0–100.0)
Monocytes Absolute: 0.5 10*3/uL (ref 0.1–1.0)
Monocytes Relative: 7 %
Neutro Abs: 4.2 10*3/uL (ref 1.7–7.7)
Neutrophils Relative %: 57 %
Platelets: 255 10*3/uL (ref 150–400)
RBC: 4.95 MIL/uL (ref 4.22–5.81)
RDW: 13 % (ref 11.5–15.5)
WBC: 7.3 10*3/uL (ref 4.0–10.5)
nRBC: 0 % (ref 0.0–0.2)

## 2021-10-14 LAB — TROPONIN I (HIGH SENSITIVITY): Troponin I (High Sensitivity): 4 ng/L (ref <18)

## 2021-10-14 LAB — MAGNESIUM: Magnesium: 2.2 mg/dL (ref 1.7–2.4)

## 2021-10-14 LAB — D-DIMER, QUANTITATIVE: D-Dimer, Quant: <0.27 ug/mL-FEU (ref 0.00–0.50)

## 2021-10-14 MED ORDER — IOHEXOL 350 MG/ML SOLN
95.0000 mL | Freq: Once | INTRAVENOUS | Status: AC | PRN
  Administered 2021-10-14: 95 mL via INTRAVENOUS

## 2021-10-14 MED ORDER — APIXABAN 5 MG PO TABS: 5.0000 mg | ORAL_TABLET | Freq: Two times a day (BID) | ORAL | Status: DC

## 2021-10-14 MED ORDER — APIXABAN (ELIQUIS) EDUCATION KIT FOR DVT/PE PATIENTS: PACK | Freq: Once | Status: DC

## 2021-10-14 MED ORDER — APIXABAN 5 MG PO TABS
10.0000 mg | ORAL_TABLET | Freq: Two times a day (BID) | ORAL | Status: DC
  Administered 2021-10-14: 10 mg via ORAL
  Filled 2021-10-14: qty 2

## 2021-10-14 MED ORDER — APIXABAN 5 MG PO TABS
ORAL_TABLET | ORAL | 2 refills | Status: DC
Start: 2021-10-14 — End: 2022-01-06

## 2021-10-14 MED ORDER — NITROGLYCERIN 0.4 MG SL SUBL
SUBLINGUAL_TABLET | SUBLINGUAL | Status: AC
  Filled 2021-10-14: qty 2

## 2021-10-14 MED ORDER — NITROGLYCERIN 0.4 MG SL SUBL
0.8000 mg | SUBLINGUAL_TABLET | Freq: Once | SUBLINGUAL | Status: AC
  Administered 2021-10-14: 0.8 mg via SUBLINGUAL

## 2021-10-14 NOTE — ED Provider Notes (Signed)
[facility] EMERGENCY DEPARTMENT Provider Note   CSN: [id number] Arrival date & time: 10/14/21  1648     History No chief complaint on file.   Max Perez is a 70 y.o. male.  HPI Patient presents for pulmonary embolism.  He has no known history of blood clots.  He has not had any recent chest pain or shortness of breath.  Recent history is as follows: He was seen in the ED in August for left-sided chest pain.  At that time, he had a negative ED work-up but was referred to outpatient cardiology follow-up.  He eventually followed up with cardiology 1 week ago.  He was scheduled for an outpatient coronary CT angiogram at that he underwent today.  On this study, there was an incidental finding of a subsegmental PE of his right lower lobe.  He was informed of this by telephone and instructed to come to the ED.  Currently, patient denies any symptoms.  He has been feeling well lately.  Patient has lived a very active life and was exercising frequently up until his ED visit in August.  Since that time, he has not been exercising.  He denies any other recent changes to his daily habits.  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 Authorizing Provider  apixaban (ELIQUIS) 5 MG TABS tablet Take 2 tablets (84m) twice daily for 7 days, then 1 tablet (553m twice daily 10/14/21  Yes DiGodfrey PickMD  cholecalciferol (VITAMIN D3) 25 MCG (1000 UNIT) tablet Take 1,000 Units by mouth daily. Take 1 tablet by mouth once daily    [provider]  losartan (COZAAR) 100 MG tablet Take 100 mg by mouth daily. Take 1 tablet by mouth once daily 04/21/21   [provider]  metoprolol tartrate (LOPRESSOR) 25 MG tablet Take 1 tablet (25 mg total) by mouth once for 1 dose. 10/06/21 10/06/21  Nahser, PhWonda ChengMD  pravastatin (PRAVACHOL) 40 MG tablet Take 40 mg by mouth daily. Take 1 tablet by mouth once daily 01/27/21   [provider]  sildenafil (VIAGRA) 100 MG tablet Take 100 mg by mouth daily as needed for erectile dysfunction.    [provider]    Allergies    Patient has no known allergies.  Review of Systems   Review of Systems  Constitutional:  Negative for activity  change, appetite change, chills, fatigue and fever.  HENT:  Negative for congestion, ear pain and sore throat.   Eyes:  Negative for pain and visual disturbance.  Respiratory:  Negative for cough, chest tightness and shortness of breath.   Cardiovascular:  Negative for chest pain and palpitations.  Gastrointestinal:  Negative for abdominal pain, diarrhea, nausea and vomiting.  Genitourinary:  Negative for dysuria, flank pain and hematuria.  Musculoskeletal:  Negative for arthralgias, back pain, myalgias and neck pain.  Skin:  Negative for color change and rash.  Neurological:  Negative for dizziness, seizures, syncope, weakness, light-headedness, numbness and headaches.  Hematological:  Does not bruise/bleed easily.  Psychiatric/Behavioral:  Negative for confusion and decreased concentration.    All other systems reviewed and are negative.  Physical Exam Updated Vital Signs BP (!) 154/93   Pulse 60   Temp 98 F (36.7 C) (Oral)   Resp 19   Ht 5' 8"  (1.727 m)   Wt 80.5 kg   SpO2 100%   BMI 26.97 kg/m   Physical Exam Vitals and nursing note reviewed.  Constitutional:      General: He is not in acute distress.    Appearance: Normal appearance. He is well-developed and normal weight. He is not ill-appearing, toxic-appearing or diaphoretic.  HENT:     Head: Normocephalic and atraumatic.     Right Ear: External ear normal.     Left Ear: External ear normal.     Nose: Nose normal.  Eyes:     Extraocular Movements: Extraocular movements intact.     Conjunctiva/sclera: Conjunctivae normal.  Cardiovascular:     Rate and Rhythm: Normal rate and regular rhythm.     Heart sounds: No murmur heard. Pulmonary:     Effort: Pulmonary effort is normal. No respiratory distress.     Breath sounds: Normal breath sounds. No wheezing or rales.  Abdominal:     Palpations: Abdomen is soft.     Tenderness: There is no abdominal tenderness.  Musculoskeletal:        General: Normal range of motion.     Cervical back: Normal range of motion and neck supple. No rigidity.     Right lower leg: No edema.     Left lower leg: No edema.  Skin:    General: Skin is warm and dry.     Capillary Refill: Capillary refill takes less than 2 seconds.     Coloration: Skin is not jaundiced or pale.  Neurological:     General: No focal deficit present.     Mental Status: He is alert and oriented to person, place, and time.     Cranial Nerves: No cranial nerve deficit.     Sensory: No sensory deficit.     Motor: No weakness.  Psychiatric:        Mood and Affect: Mood normal.        Behavior: Behavior normal.        Thought Content: Thought content normal.        Judgment: Judgment normal.    ED Results / Procedures / Treatments   Labs (all labs ordered are listed, but only abnormal results are  displayed) Labs Reviewed  COMPREHENSIVE METABOLIC PANEL - Abnormal; Notable for the following components:      Result Value   Glucose, Bld 110 (*)    Creatinine, Ser 1.27 (*)    All other components within normal limits  MAGNESIUM  CBC WITH DIFFERENTIAL/PLATELET  D-DIMER, QUANTITATIVE  TROPONIN I (HIGH  SENSITIVITY)  TROPONIN I (HIGH SENSITIVITY)    EKG None  Radiology CT CORONARY MORPH W/CTA COR W/SCORE W/CA W/CM &/OR WO/CM  Addendum Date: 10/14/2021   ADDENDUM REPORT: 10/14/2021 13:04 CLINICAL DATA:  This is a 70 year old male with chest pain. Medical history includes hypertension and hyperlipidemia. EXAM: Cardiac/Coronary  CTA TECHNIQUE: The patient was scanned on a [employer]. FINDINGS: A 100 kV prospective scan was triggered in the descending thoracic aorta at 111 HU's. Axial non-contrast 3 mm slices were carried out through the heart. The data set was analyzed on a dedicated work station and scored using the [place]. Gantry rotation speed was 250 msecs and collimation was .6 mm. No beta blockade and 0.8 mg of sl NTG was given. The 3D data set was reconstructed in 5% intervals of the 67-82 % of the R-R cycle. Diastolic phases were analyzed on a dedicated work station using MPR, MIP and VRT modes. The patient received 80 cc of contrast. Aorta: Normal size (35 mm).  No calcifications.  No dissection. Aortic Valve:  Trileaflet.  No calcifications. Coronary Arteries:  Normal coronary origin.  Right dominance. RCA is a large dominant artery that gives rise to PDA and PLA. There is minimal (<24%) diffuse calcified plaques in the proximal to mid RCA. In the distal RCA right above the bifurcation there is a minimal calcified plaque. Left main is a large artery that gives rise to LAD and LCX arteries. There is mild calcified plaque in the distal left main. LAD is a large type 3 vessel. The proximal LAD with mild (25-49%) calcified plaques. There is mild calcified plaque in the mid  LAD. The mid to distal LAD with diffuse minimal calcified plaques. D1 and D2 as small caliber vessels with no plaque. D3 is a small caliber vessel with minimal calcification mid vessel. LCX is a non-dominant artery that gives rise to one large OM1 branch. There is no plaque in the LCX. The OM1 vessel with mild calcified plaque in the proximal portion. The mid and distal portion with no plaques. Coronary Calcium Score: Left main: 0 Left anterior descending artery: 467 Left circumflex artery: 116 Right coronary artery: 317 Total: 901 Percentile: 94 Other findings: Normal pulmonary vein drainage into the left atrium. Normal left atrial appendage without a thrombus. Normal size of the pulmonary artery. IMPRESSION: 1. Coronary calcium score of 901. This was 31 percentile for age and sex matched control. 2. Normal coronary origin with right dominance. 3. No evidence of CAD. CAD-RADS 2. Mild non-obstructive CAD (25-49%). Consider non-atherosclerotic causes of chest pain. Consider preventive therapy and risk factor modification. Electronically Signed   By: Berniece Salines D.O.   On: 10/14/2021 13:04   Result Date: 10/14/2021 EXAM: OVER-READ INTERPRETATION  CT CHEST The following report is an over-read performed by radiologist Dr. Vinnie Langton of [facility], [place] on 10/14/2021. This over-read does not include interpretation of cardiac or coronary anatomy or pathology. The coronary calcium score/coronary CTA interpretation by the cardiologist is attached. COMPARISON:  Chest CTA 07/08/2021. FINDINGS: Series 9 images 98-108 demonstrate a filling defect within a subsegmental sized pulmonary arterial branch to the right lower lobe, compatible with pulmonary embolism. Tiny calcified granuloma in the right lower lobe. Within the visualized portions of the thorax there are no suspicious appearing pulmonary nodules or masses, there is no acute consolidative airspace disease, no pleural effusions, no pneumothorax and no  lymphadenopathy. Visualized portions of the upper abdomen are unremarkable. There are no aggressive appearing  lytic or blastic lesions noted in the visualized portions of the skeleton. IMPRESSION: 1. Study is positive for subsegmental sized pulmonary embolus to the right lower lobe. These results will be called to the ordering clinician or representative by the Radiologist Assistant, and communication documented in the PACS or [employer]. Electronically Signed: By: Vinnie Langton M.D. On: 10/14/2021 09:34    Procedures Procedures   Medications Ordered in ED Medications  apixaban [facility]) Education Kit for DVT/PE patients (has no administration in time range)  apixaban (ELIQUIS) tablet 10 mg (10 mg Oral Given 10/14/21 2101)    Followed by  apixaban (ELIQUIS) tablet 5 mg (has no administration in time range)    ED Course  I have reviewed the triage vital signs and the nursing notes.  Pertinent labs & imaging results that were available during my care of the patient were reviewed by me and considered in my medical decision making (see chart for details).    MDM Rules/Calculators/A&P                          Patient is a 70 year old male who presents for an incidental finding of right lower lobe subsegmental PE on coronary CT angiogram.  He denies any current or recent symptoms.  He has no known history of VTE.  He has stopped his physical exercise routine over the past 3 months.  Vital signs are normal upon arrival in the ED.  Patient's breathing is even and unlabored.  SPO2 is 100% on room air.  He did confirm that he has no known history of cancer, heart failure, chronic lung disease.  Given this, patient is a PESI score of 80.  This puts him as class II/low risk.  I did discuss this with him and he is agreeable to initiation of DOAC for outpatient management.  In addition to his cardiologist, patient does see a primary care doctor.  He is a very physically fit 70 year old and is a low  fall risk at this time.  I did discuss with him the risk benefits of initiating anticoagulation.  Patient was prescribed Eliquis.  He was advised to follow-up with his primary care doctor for possible further testing and to determine duration of therapy.  He was also encouraged to return to the ED if he does experience any new symptoms, including shortness of breath, chest pain, or fatigue.  Patient expressed understanding.  He was discharged in good condition.  Final Clinical Impression(s) / ED Diagnoses Final diagnoses:  Single subsegmental pulmonary embolism without acute cor pulmonale ([place])    Rx / DC Orders ED Discharge Orders          Ordered    apixaban (ELIQUIS) 5 MG TABS tablet        10/14/21 2034             Godfrey Pick, MD 10/15/21 [phone]

## 2021-10-14 NOTE — Telephone Encounter (Signed)
Spoke with the patient and his wife. I have advised them on recommendations to go to the ER for evaluation due to pulmonary embolism seen on CT scan. Patient and his wife verbalized understanding and will go to [facility] ER now.

## 2021-10-14 NOTE — Telephone Encounter (Signed)
Left message for patient with results and advisement to go to the ER.

## 2021-10-14 NOTE — Discharge Instructions (Addendum)
A prescription for Eliquis was sent to [facility] on [address]  This is a blood thinner to prevent worsening of the blood clot that is in your lung.  Use of this medication will put you at increased risk for bleeding.  You will need to follow-up with your primary care doctor to discuss duration of this therapy and possible further testing.  Continue to take your other prescribed medications.  If you do develop any chest pain or shortness of breath, please return to the emergency department.  --------------------------------------------------------------------------------------------------------------------------------------------------------------------- Information on my medicine - ELIQUIS (apixaban)  This medication education was reviewed with me or my healthcare representative as part of my discharge preparation.    Why was Eliquis prescribed for you? Eliquis was prescribed to treat blood clots that may have been found in the veins of your legs (deep vein thrombosis) or in your lungs (pulmonary embolism) and to reduce the risk of them occurring again.  What do You need to know about Eliquis ? The starting dose is 10 mg (two 5 mg tablets) taken TWICE daily for the FIRST SEVEN (7) DAYS, then on (enter date)  10/21/2021 evening, the dose is reduced to ONE 5 mg tablet taken TWICE daily.  Eliquis may be taken with or without food.   Try to take the dose about the same time in the morning and in the evening. If you have difficulty swallowing the tablet whole please discuss with your pharmacist how to take the medication safely.  Take Eliquis exactly as prescribed and DO NOT stop taking Eliquis without talking to the doctor who prescribed the medication.  Stopping may increase your risk of developing a new blood clot.  Refill your prescription before you run out.  After discharge, you should have regular check-up appointments with your healthcare provider that is prescribing your  Eliquis.    What do you do if you miss a dose? If a dose of ELIQUIS is not taken at the scheduled time, take it as soon as possible on the same day and twice-daily administration should be resumed. The dose should not be doubled to make up for a missed dose.  Important Safety Information A possible side effect of Eliquis is bleeding. You should call your healthcare provider right away if you experience any of the following: Bleeding from an injury or your nose that does not stop. Unusual colored urine (red or dark brown) or unusual colored stools (red or black). Unusual bruising for unknown reasons. A serious fall or if you hit your head (even if there is no bleeding).  Some medicines may interact with Eliquis and might increase your risk of bleeding or clotting while on Eliquis. To help avoid this, consult your healthcare provider or pharmacist prior to using any new prescription or non-prescription medications, including herbals, vitamins, non-steroidal anti-inflammatory drugs (NSAIDs) and supplements.  This website has more information on Eliquis (apixaban): http://www.eliquis.com/eliquis/home

## 2021-10-14 NOTE — ED Provider Notes (Addendum)
Emergency Medicine Provider Triage Evaluation Note  Max Perez , a 70 y.o. male  was evaluated in triage.  Patient presenting with his wife after a phone call from [facility] health heart care stating that he had a large blood clot in his lungs.  They reportedly called multiple times telling him he needed to get to an emergency department quickly.  Original CTA from 930 this morning does not show any pulmonary embolism, however edited shows a subsegmental sized pulmonary embolus to the right lower lobe.  Review of Systems  Positive: No sx Negative: Chest pain, shortness of breath or dizziness  Physical Exam  BP (!) 157/87 (BP Location: Left Arm)   Pulse 64   Temp 98.3 F (36.8 C) (Oral)   Resp 18   Ht 5\' 8"  (1.727 m)   Wt 80.5 kg   SpO2 99%   BMI 26.97 kg/m  Gen:   Awake, no distress   Resp:  Normal effort  MSK:   Moves extremities without difficulty  Other:    Medical Decision Making  Medically screening exam initiated at 5:13 PM.  Appropriate orders placed.  Max Perez was informed that the remainder of the evaluation will be completed by another provider, this initial triage assessment does not replace that evaluation, and the importance of remaining in the ED until their evaluation is complete.     Jed Limerick, PA-C 10/14/21 1717    13/08/22, PA-C 10/14/21 1734    13/08/22, MD 10/15/21 [phone]

## 2021-10-14 NOTE — ED Notes (Signed)
Dc instructions reviewed with pt. PT verbalized understanding. Pt DC.  °

## 2021-10-14 NOTE — Telephone Encounter (Signed)
Left message for patient to call back on all numbers listed.  Attempted to call patient's wife, Corrie Dandy [facility]) - unable to leave voicemail.  Attempted to call patient's sister, Britta Mccreedy [facility]) - unable to leave voicemail.  Attempted to call patient's additional EC - unable to leave voicemail.

## 2021-10-14 NOTE — Telephone Encounter (Signed)
Spoke with Erskine Squibb at [facility] who reports an over-read from the patient's cardiac CT showing a subsegmental size PE in his right lower lobe.  Per DOD, Dr. Eden Emms, patient needs to go to the ER.  Left message for patient to call back.  Called back to radiology department and patient has already left.

## 2021-10-14 NOTE — Progress Notes (Signed)
CT scan completed. Tolerated well. D/C home ambulatory with wife, awake and alert. In no distress 

## 2021-10-14 NOTE — ED Triage Notes (Signed)
Patient states he was called to come to ER for CT done today showing PE. Patient denies any chest pain or shortness of breath. Wife states had chest pain back in July and just now able to get an appt with cardiology that just increased his statin.

## 2021-10-14 NOTE — Telephone Encounter (Signed)
[facility] calling with a STAT call report

## 2021-10-16 ENCOUNTER — Other Ambulatory Visit: Payer: Self-pay

## 2021-10-16 ENCOUNTER — Telehealth: Payer: Self-pay | Admitting: Cardiovascular Disease

## 2021-10-16 ENCOUNTER — Ambulatory Visit: Payer: Medicare PPO | Attending: Physician Assistant | Admitting: Audiologist

## 2021-10-16 ENCOUNTER — Other Ambulatory Visit: Payer: Medicare PPO | Admitting: *Deleted

## 2021-10-16 DIAGNOSIS — H903 Sensorineural hearing loss, bilateral: Secondary | ICD-10-CM | POA: Insufficient documentation

## 2021-10-16 DIAGNOSIS — Z79899 Other long term (current) drug therapy: Secondary | ICD-10-CM

## 2021-10-16 DIAGNOSIS — R931 Abnormal findings on diagnostic imaging of heart and coronary circulation: Secondary | ICD-10-CM

## 2021-10-16 DIAGNOSIS — E785 Hyperlipidemia, unspecified: Secondary | ICD-10-CM

## 2021-10-16 NOTE — Telephone Encounter (Signed)
The patient has been notified of the result and verbalized understanding.  All questions (if any) were answered. Loa Socks, LPN 58/83/2549 82:64 AM    Pt confirmed he has his eliquis on hand and is taking it as prescribed with no missed doses, for noted PE on Cardiac CT.  Scheduled the pt to come in for fasting lipids for today 11/10.  He confirmed with me that he hasn't had anything to eat or drink, other than water, from midnight to current time.  Pt will come into our lab today for his fasting lipids. Pt verbalized understanding and agrees with this plan.

## 2021-10-16 NOTE — Telephone Encounter (Signed)
Pt returning phone call for results... please advise °

## 2021-10-16 NOTE — Telephone Encounter (Signed)
-----   Message from Vesta Mixer, MD sent at 10/15/2021  7:03 AM EST ----- Coronary calcium score of 901. This was 29 percentile for age and sex matched control  He remains asymptomatic.   His LDL goal is < 70. Please have Merrell come in for fasting lipids   He is currently on Pravachol.  He may need to be started on a high intensity statin or be referred to the Lipid clinic for consideration of PSCK9 inhibitor or Inclisiran.  Subsegmental pulmonary embolus. Pt was sent to the ER by our DOD. He is asymptomatic, was started on Eliquis

## 2021-10-16 NOTE — Procedures (Signed)
  Outpatient Audiology and [facility] [address] [place], [place]  [address] [phone]  AUDIOLOGICAL  EVALUATION  NAME: Max Perez     DOB:   1951-06-09      MRN: [id number]                                                                                     DATE: 10/16/2021     REFERENT: Burnice Logan, PA STATUS: Outpatient DIAGNOSIS: Sensorineural Hearing Loss    History: Max Perez was seen for an audiological evaluation. Max Perez was accompanied to the appointment by his wife.  Max Perez is receiving a hearing evaluation due to concerns for difficulty hearing, especially from the left ear. Max Perez has difficulty hearing in background noise, crowds, and when people are at a distance. This difficulty began gradually over the last few years. No pain or pressure reported in either ear.  Tinnitus denied for both ears. Max Perez has a history of noise exposure from working in a loud environment and going to concerts.  Medical history positive for previous head injury which is a risk factor for hearing loss. No other relevant case history reported.   Evaluation:  Otoscopy showed a clear view of the tympanic membranes, bilaterally Tympanometry results were consistent with normal middle ear function, bilaterally   Audiometric testing was completed using conventional audiometry with insert transducer. Speech Recognition Thresholds were consistent with pure tone averages. Word Recognition was excellent at conversation and an elevated level. Pure tone thresholds show normal sloping to moderate sensorineural hearing loss in both ears. 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   Results:  The test results were reviewed with Max Perez and his wife. The test results were reviewed with Max Perez, he was counseled on the nature and degree of his hearing loss. He was provided with several copies of his audiogram that illustrate his degree of hearing loss in both ears. His  hearing loss is in the high frequencies preventing Max Perez from hearing high frequency consonants such as /s/ /sh/ /f/ /t/ and /th/. These sounds help differentiate the words he  hears. Without these sounds, speech is muffled and unclear unless someone is face to face within 5 feet without a mask. Max Perez is an excellent hearing aid candidate. With hearing aids the clarity of speech will improve and Max Perez will not have to work so hard to hear. Max Perez was provided with a list of audiologists that dispense hearing aids. This hearing test can be used to program hearing aids for the next six months, after that he will have to be retested to check for changes.     Recommendations: Amplification recommended for both ears if Max Perez is ready for regular use. Hearing aids can be purchased from a variety of locations. See provided list for locations in the [place] area.    Max Perez  Audiologist, Au.D., CCC-A 10/16/2021  12:59 PM  Cc: Burnice Logan, [place]

## 2021-10-17 LAB — LIPID PANEL
Chol/HDL Ratio: 3.3 ratio (ref 0.0–5.0)
Cholesterol, Total: 140 mg/dL (ref 100–199)
HDL: 43 mg/dL (ref >39)
LDL Chol Calc (NIH): 81 mg/dL (ref 0–99)
Triglycerides: 82 mg/dL (ref 0–149)
VLDL Cholesterol Cal: 16 mg/dL (ref 5–40)

## 2021-10-20 ENCOUNTER — Telehealth: Payer: Self-pay

## 2021-10-20 DIAGNOSIS — E785 Hyperlipidemia, unspecified: Secondary | ICD-10-CM

## 2021-10-20 DIAGNOSIS — Z79899 Other long term (current) drug therapy: Secondary | ICD-10-CM

## 2021-10-20 MED ORDER — ROSUVASTATIN CALCIUM 10 MG PO TABS: 10.0000 mg | ORAL_TABLET | Freq: Every day | ORAL | 11 refills | Status: DC

## 2021-10-20 NOTE — Telephone Encounter (Signed)
-----   Message from Vesta Mixer, MD sent at 10/17/2021  6:15 PM EST ----- He has mild CAD and a high coronary calcium score. His goal LDL is < 70 He is on pravachol  Will he try rosuvastatin 10 mg a day ?  And recheck lipids and ALT in 3 months .  If he has already tried rosuvastatin and did not tolerate it, Lets add zetia 10 mg a day and reckeck  lipids and ALT in 3 months

## 2021-10-20 NOTE — Telephone Encounter (Signed)
Called patient with results. Patient stated he has not tried rosuvastatin and would like to try it. Patient will come in the beginning of next year for repeat lab work. Patient's medication list updated.

## 2022-01-06 ENCOUNTER — Telehealth: Payer: Self-pay | Admitting: Cardiovascular Disease

## 2022-01-06 ENCOUNTER — Other Ambulatory Visit: Payer: Self-pay

## 2022-01-06 DIAGNOSIS — I2699 Other pulmonary embolism without acute cor pulmonale: Secondary | ICD-10-CM

## 2022-01-06 NOTE — Telephone Encounter (Signed)
°  Pt c/o medication issue:  1. Name of Medication: apixaban (ELIQUIS) 5 MG TABS tablet  2. How are you currently taking this medication (dosage and times per day)? Take 2 tablets (10mg ) twice daily for 7 days, then 1 tablet (5mg ) twice daily  3. Are you having a reaction (difficulty breathing--STAT)?   4. What is your medication issue?   Pt's wife calling, she said pt been taking eliquis for 3 months now and wanted to know if he needs to continue taking this meds. She said to call back the pt

## 2022-01-06 NOTE — Telephone Encounter (Addendum)
Eliquis 5mg  refill request received. Patient is 71 years old, weight-80.5kg, Crea-1.27 on 10/14/2021, Diagnosis-PE (started in ER on 10/14/2021), and last seen by Dr. 13/07/2021 on 10/06/2021. Dose is appropriate based on dosing criteria. Per Dr. 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 PN  Refill sent to requested pharmacy.

## 2022-01-06 NOTE — Telephone Encounter (Signed)
Mr. 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    PN    Spoke with pt and made him aware of information.  Pt agreeable to stay on Eliquis.

## 2022-01-07 MED ORDER — APIXABAN 5 MG PO TABS: 5.0000 mg | ORAL_TABLET | Freq: Two times a day (BID) | ORAL | 5 refills | Status: DC

## 2022-02-18 ENCOUNTER — Other Ambulatory Visit: Payer: Self-pay

## 2022-02-18 ENCOUNTER — Other Ambulatory Visit: Payer: Medicare PPO | Admitting: *Deleted

## 2022-02-18 DIAGNOSIS — E785 Hyperlipidemia, unspecified: Secondary | ICD-10-CM

## 2022-02-18 DIAGNOSIS — Z79899 Other long term (current) drug therapy: Secondary | ICD-10-CM

## 2022-02-18 LAB — LIPID PANEL
Chol/HDL Ratio: 3 ratio (ref 0.0–5.0)
Cholesterol, Total: 135 mg/dL (ref 100–199)
HDL: 45 mg/dL
LDL Chol Calc (NIH): 72 mg/dL (ref 0–99)
Triglycerides: 98 mg/dL (ref 0–149)
VLDL Cholesterol Cal: 18 mg/dL (ref 5–40)

## 2022-02-18 LAB — ALT: ALT: 22 [IU]/L (ref 0–44)

## 2022-03-04 IMAGING — CR DG CHEST 2V
2 series · 2 of 2 positions shown · non-contrast
Comparison: Chest x-ray dated February 28, 2003

CLINICAL DATA: Chest pain

EXAM:
CHEST - 2 VIEW

[chest pa]
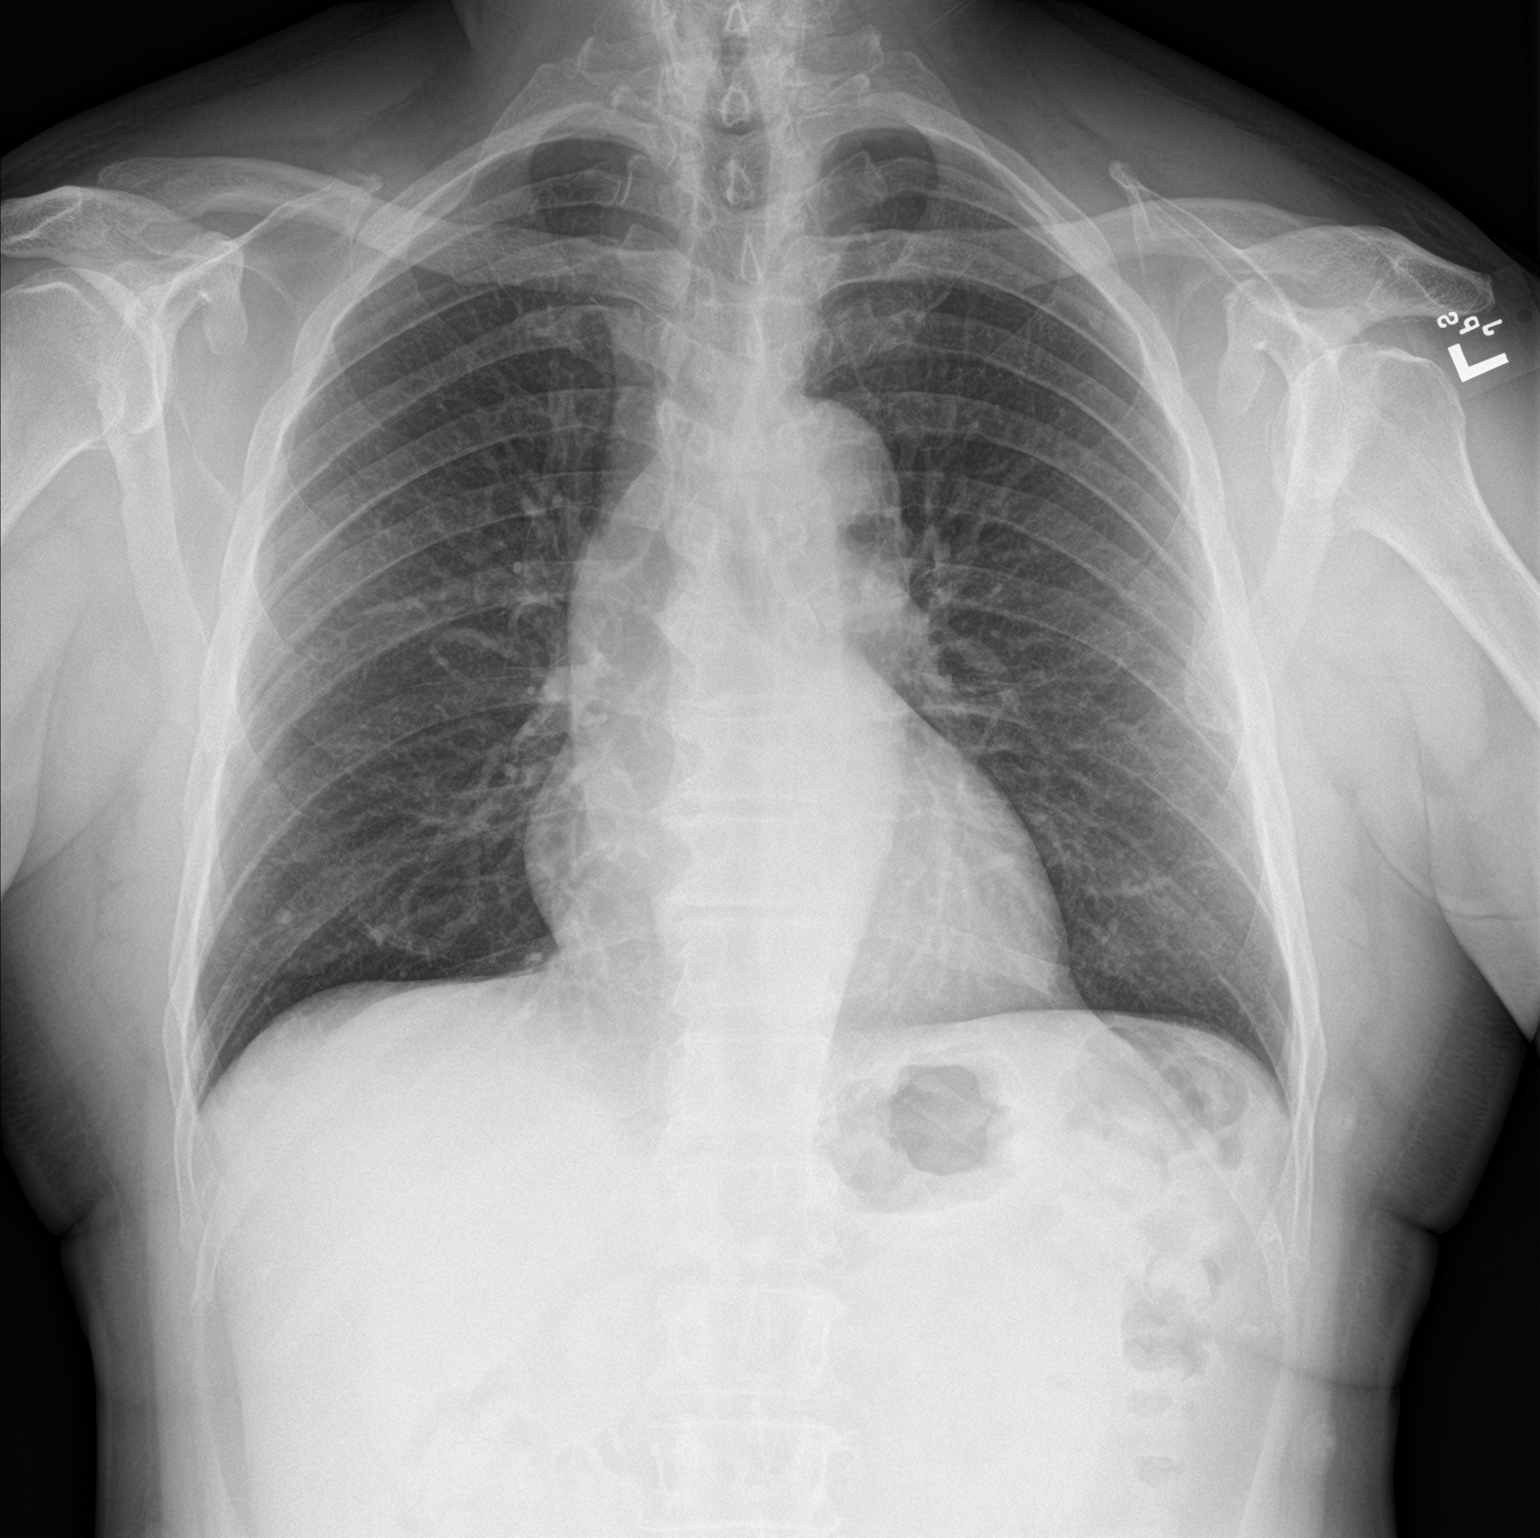

[chest lat]
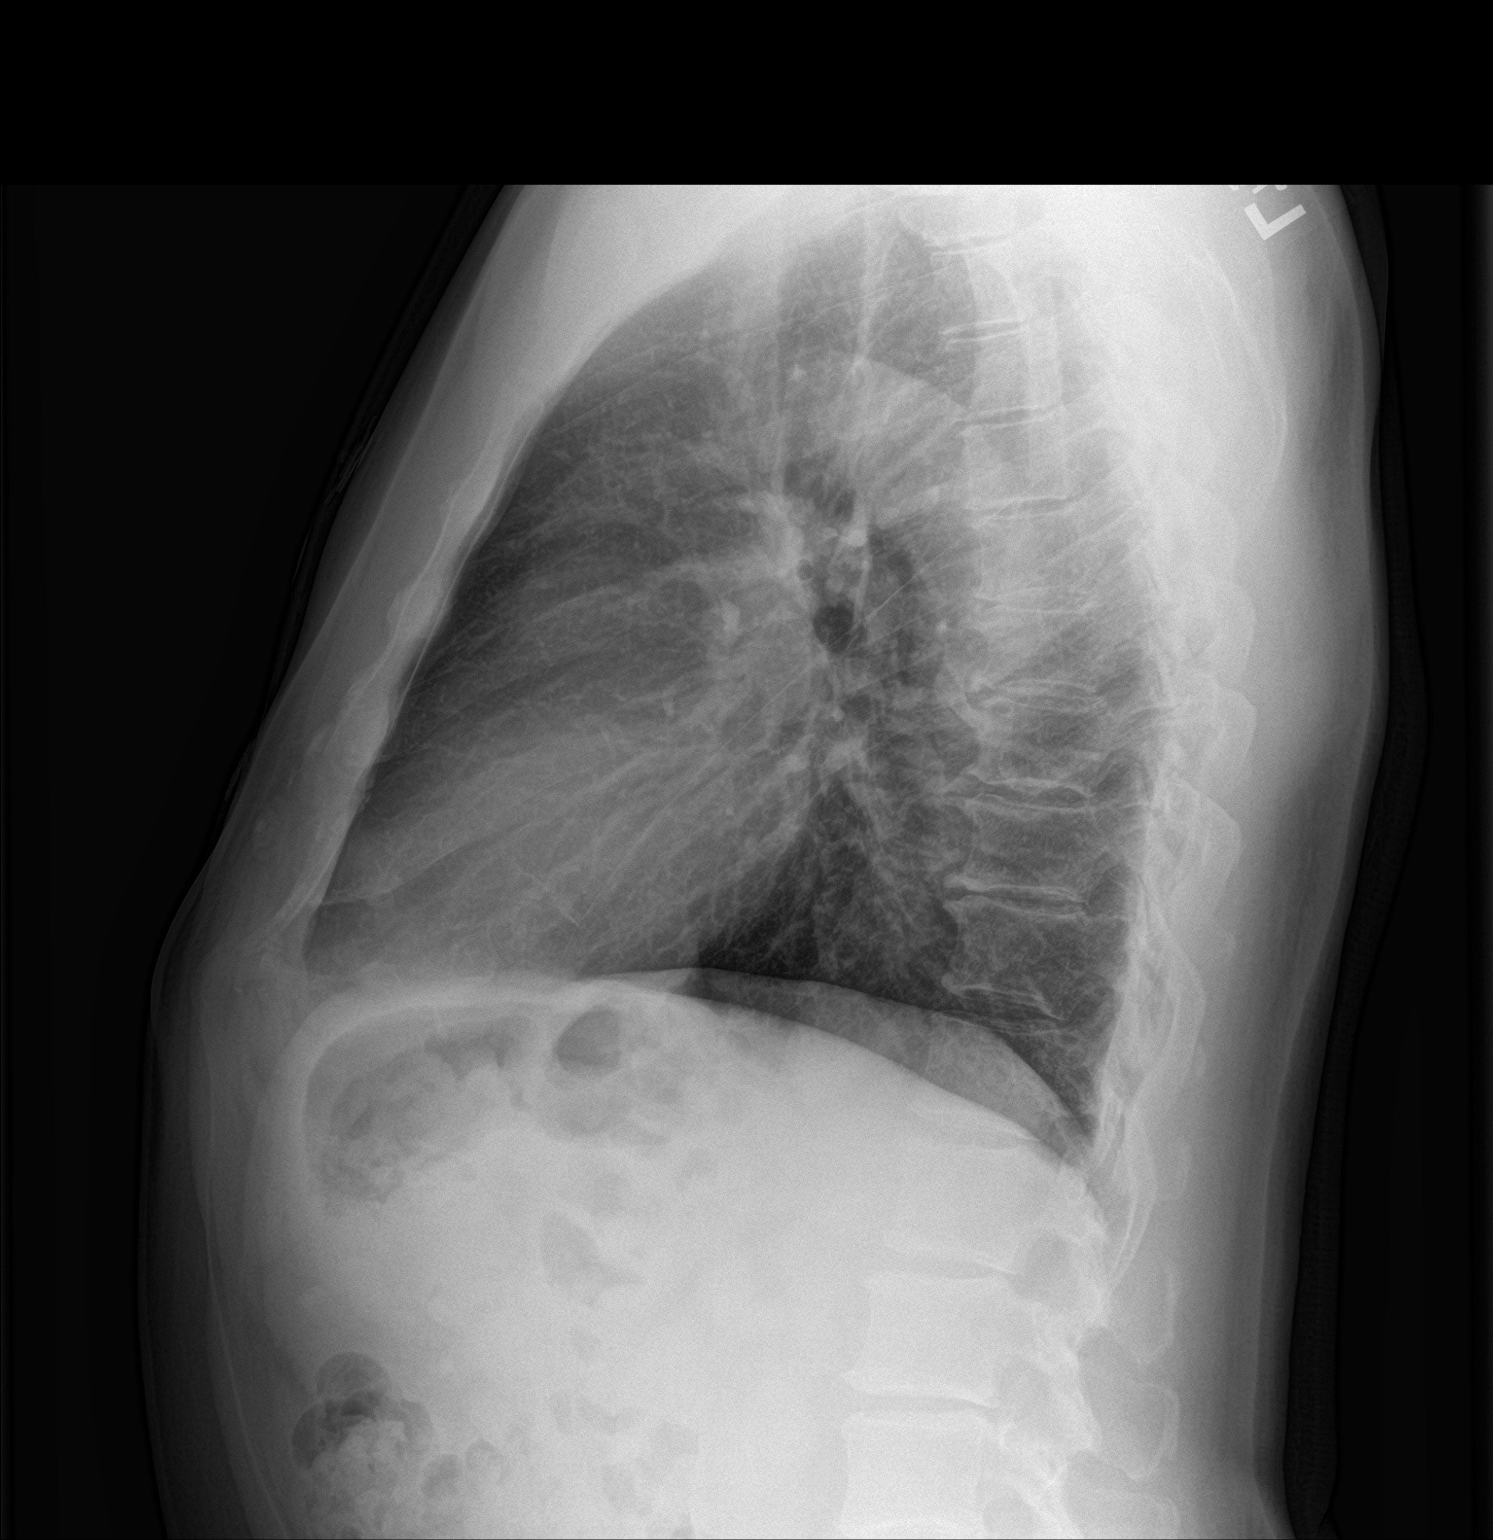

[2 of 2 positions shown; findings below may reference images not displayed]

FINDINGS: Normal heart size. Tortuous thoracic aorta. Lungs are clear. No
acute pleural abnormalities.
IMPRESSION: Tortuous thoracic aorta, possibly age related, although dilation of
the ascending thoracic aorta could have a similar appearance. This
could be further evaluated with CTA of the chest.

## 2022-04-06 ENCOUNTER — Ambulatory Visit: Payer: Medicare PPO | Admitting: Cardiovascular Disease

## 2022-04-06 ENCOUNTER — Encounter: Payer: Self-pay | Admitting: Cardiovascular Disease

## 2022-04-06 VITALS — BP 152/94 | HR 74 | Ht 68.0 in

## 2022-04-06 DIAGNOSIS — I10 Essential (primary) hypertension: Secondary | ICD-10-CM | POA: Diagnosis not present

## 2022-04-06 DIAGNOSIS — I2699 Other pulmonary embolism without acute cor pulmonale: Secondary | ICD-10-CM

## 2022-04-06 MED ORDER — ROSUVASTATIN CALCIUM 10 MG PO TABS: 10.0000 mg | ORAL_TABLET | Freq: Every day | ORAL | 3 refills | Status: DC

## 2022-04-06 NOTE — Progress Notes (Signed)
?Cardiology Office Note:   ? ?Date:  04/06/2022  ? ?ID:  Max Perez, DOB Oct 22, 1951, MRN [id number] ? ?PCP:  Fredrich Romans, [place] ?  ?[facility] Providers ?Cardiologist:  Mycal Conde  ?  ? ?Referring MD: Fredrich Romans, PA  ? ?Chief Complaint  ?Patient presents with  ? Hypertension  ?   ?  ? Hyperlipidemia  ? ? ?Problem LIst ?Pulmonary embolus ( possibly a COVID related )  ? HTN ?Mild CAD  ?Hyperlipidemia  ? ?History of Present Illness:   ? ?Max Perez is a 71 y.o. male with a hx of chest pain, HTN, HLD, pulmonary embolus  ?We were asked to see him for an episode of CP by Dr. Langston Masker ? ?3 months ago ?Sharp pain ,  lasted a few seconds ?Not associated with any palpitations, lightheadedness  ?He was previously exercising every day.  Has not been exercising since that time because of concern about this chest pain. ?Not associated with deep breath, no assoc with twisting or turing his torso ?He recalls having this pain on 2 different occasions.  ?Went to the ER  ?CT showed some coronary artery calcification  ?No aortic dissection .  ? ?Apr 06, 2022: ?We have is seen today for follow-up of his chest pain, hypertension, hyperlipidemia. ?Coronary CT angiogram performed in November, 2022 reveals ?Left main-normal ?LAD has mild to moderate plaque in the proximal vessel ?Left circumflex artery is small and nondominant. ?Right coronary artery is large and dominant.  There is mild irregularities in the proximal and mid RCA. ? ?We started him on Crestor  ? ?Will give him a DASH diet  ?Has not exercised since his PE  ? ? ?Past Medical History:  ?Diagnosis Date  ? Chest pain   ? Elevated blood pressure reading   ? Elevated BP   ? Hypercalcemia   ? Hyperlipidemia   ? Hyperplastic colonic polyp   ? ? ?Past Surgical History:  ?Procedure Laterality Date  ? COLONOSCOPY  2010  ? hyperplastic polyp  ? ? ?Current Medications: ?Current Meds  ?Medication Sig  ? apixaban (ELIQUIS) 5 MG TABS tablet Take 5 mg by mouth daily.  ?  cholecalciferol (VITAMIN D3) 25 MCG (1000 UNIT) tablet Take 1,000 Units by mouth daily. Take 1 tablet by mouth once daily  ? losartan (COZAAR) 100 MG tablet Take 100 mg by mouth daily. Take 1 tablet by mouth once daily  ? sildenafil (VIAGRA) 100 MG tablet Take 100 mg by mouth daily as needed for erectile dysfunction.  ? [DISCONTINUED] apixaban (ELIQUIS) 5 MG TABS tablet Take 1 tablet (5 mg total) by mouth 2 (two) times daily. (Patient taking differently: Take 5 mg by mouth daily.)  ? [DISCONTINUED] rosuvastatin (CRESTOR) 10 MG tablet Take 1 tablet (10 mg total) by mouth daily.  ?  ? ?Allergies:   Patient has no known allergies.  ? ?Social History  ? ?Socioeconomic History  ? Marital status: Single  ?  Spouse name: Not on file  ? Number of children: Not on file  ? Years of education: Not on file  ? Highest education level: Not on file  ?Occupational History  ? Not on file  ?Tobacco Use  ? Smoking status: Never  ? Smokeless tobacco: Never  ?Substance and Sexual Activity  ? Alcohol use: No  ? Drug use: No  ? Sexual activity: Not on file  ?Other Topics Concern  ? Not on file  ?Social History Narrative  ? Not on file  ? ?Social Determinants of  Health  ? ?Financial Resource Strain: Not on file  ?Food Insecurity: Not on file  ?Transportation Needs: Not on file  ?Physical Activity: Not on file  ?Stress: Not on file  ?Social Connections: Not on file  ?  ? ?Family History: ?The patient's family history includes Heart disease in his sister; Lung cancer in his sister; Stroke (age of onset: 81) in his father. There is no history of Diabetes or Heart attack. ? ?ROS:   ?Please see the history of present illness.    ? All other systems reviewed and are negative. ? ?EKGs/Labs/Other Studies Reviewed:   ? ?The following studies were reviewed today: ? ? ?EKG:   ? ?Recent Labs: ?10/14/2021: BUN 17; Creatinine, Ser 1.27; Hemoglobin 14.5; Magnesium 2.2; Platelets 255; Potassium 4.0; Sodium 139 ?02/18/2022: ALT 22  ?Recent Lipid Panel ?    ?Component Value Date/Time  ? CHOL 135 02/18/2022 0908  ? CHOL 199 09/27/2014 1027  ? TRIG 98 02/18/2022 0908  ? TRIG 63 09/27/2014 1027  ? HDL 45 02/18/2022 0908  ? HDL 45 09/27/2014 1027  ? CHOLHDL 3.0 02/18/2022 0908  ? CHOLHDL 5 07/26/2009 1004  ? VLDL 14.6 07/26/2009 1004  ? Rake 72 02/18/2022 0908  ? LDLCALC 141 (H) 09/27/2014 1027  ? LDLDIRECT 164.7 07/26/2009 1004  ? ? ? ?Risk Assessment/Calculations:   ?  ? ?    ? ?Physical Exam:   ? ?Physical Exam: ?Blood pressure (!) 152/94, pulse 74, height 5\' 8"  (1.727 m), SpO2 99 %. ? ?GEN:  Well nourished, well developed in no acute distress ?HEENT: Normal ?NECK: No JVD; No carotid bruits ?LYMPHATICS: No lymphadenopathy ?CARDIAC: RRR , no murmurs, rubs, gallops ?RESPIRATORY:  Clear to auscultation without rales, wheezing or rhonchi  ?ABDOMEN: Soft, non-tender, non-distended ?MUSCULOSKELETAL:  No edema; No deformity  ?SKIN: Warm and dry ?NEUROLOGIC:  Alert and oriented x 3 ?  ? ?ASSESSMENT:   ? ?1. Hypertension, unspecified type   ?2. Pulmonary embolism, other, unspecified chronicity, unspecified whether acute cor pulmonale present ([place])   ? ? ?PLAN:   ? ?  ? ?Chest discomfort:   - related to a pulmonary embolus . ?2.  Pulmonary embolus:  likely will need Eliquis lifelong.   This seems like an unprovoked PE  ? ? ? 2.  HTN:   will give DASH diet.   Recommended more exercise  ? ?He needs to get a medical doctor  ? ?  ? ?Medication Adjustments/Labs and Tests Ordered: ?Current medicines are reviewed at length with the patient today.  Concerns regarding medicines are outlined above.  ?No orders of the defined types were placed in this encounter. ? ?Meds ordered this encounter  ?Medications  ? rosuvastatin (CRESTOR) 10 MG tablet  ?  Sig: Take 1 tablet (10 mg total) by mouth daily.  ?  Dispense:  90 tablet  ?  Refill:  3  ? ? ?Patient Instructions  ?Medication Instructions:  ?Your physician recommends that you continue on your current medications as directed. Please refer  to the Current Medication list given to you today. ? ?*If you need a refill on your cardiac medications before your next appointment, please call your pharmacy* ? ? ?Lab Work: ?None ?If you have labs (blood work) drawn today and your tests are completely normal, you will receive your results only by: ?MyChart Message (if you have MyChart) OR ?A paper copy in the mail ?If you have any lab test that is abnormal or we need to change your treatment, we  will call you to review the results. ? ?Follow-Up: ?At [facility], you and your health needs are our priority.  As part of our continuing mission to provide you with exceptional heart care, we have created designated Provider Care Teams.  These Care Teams include your primary Cardiologist (physician) and Advanced Practice Providers (APPs -  Physician Assistants and Nurse Practitioners) who all work together to provide you with the care you need, when you need it. ? ? ?Your next appointment:   ?6 month(s) ? ?The format for your next appointment:   ?In Person ? ?Provider:   ?Richardson Dopp, PA-C     { ? ?DASH Eating Plan ?DASH stands for Dietary Approaches to Stop Hypertension. The DASH eating plan is a healthy eating plan that has been shown to: ?Reduce high blood pressure (hypertension). ?Reduce your risk for type 2 diabetes, heart disease, and stroke. ?Help with weight loss. ?What are tips for following this plan? ?Reading food labels ?Check food labels for the amount of salt (sodium) per serving. Choose foods with less than 5 percent of the Daily Value of sodium. Generally, foods with less than 300 milligrams (mg) of sodium per serving fit into this eating plan. ?To find whole grains, look for the word "whole" as the first word in the ingredient list. ?Shopping ?Buy products labeled as "low-sodium" or "no salt added." ?Buy fresh foods. Avoid canned foods and pre-made or frozen meals. ?Cooking ?Avoid adding salt when cooking. Use salt-free seasonings or herbs instead  of table salt or sea salt. Check with your health care provider or pharmacist before using salt substitutes. ?Do not fry foods. Cook foods using healthy methods such as baking, boiling, grilling, roasting, and broil

## 2022-04-06 NOTE — Patient Instructions (Addendum)
Medication Instructions:  ?Your physician recommends that you continue on your current medications as directed. Please refer to the Current Medication list given to you today. ? ?*If you need a refill on your cardiac medications before your next appointment, please call your pharmacy* ? ? ?Lab Work: ?None ?If you have labs (blood work) drawn today and your tests are completely normal, you will receive your results only by: ?MyChart Message (if you have MyChart) OR ?A paper copy in the mail ?If you have any lab test that is abnormal or we need to change your treatment, we will call you to review the results. ? ?Follow-Up: ?At [facility], you and your health needs are our priority.  As part of our continuing mission to provide you with exceptional heart care, we have created designated Provider Care Teams.  These Care Teams include your primary Cardiologist (physician) and Advanced Practice Providers (APPs -  Physician Assistants and Nurse Practitioners) who all work together to provide you with the care you need, when you need it. ? ? ?Your next appointment:   ?6 month(s) ? ?The format for your next appointment:   ?In Person ? ?Provider:   ?Tereso Newcomer, PA-C     { ? ?DASH Eating Plan ?DASH stands for Dietary Approaches to Stop Hypertension. The DASH eating plan is a healthy eating plan that has been shown to: ?Reduce high blood pressure (hypertension). ?Reduce your risk for type 2 diabetes, heart disease, and stroke. ?Help with weight loss. ?What are tips for following this plan? ?Reading food labels ?Check food labels for the amount of salt (sodium) per serving. Choose foods with less than 5 percent of the Daily Value of sodium. Generally, foods with less than 300 milligrams (mg) of sodium per serving fit into this eating plan. ?To find whole grains, look for the word "whole" as the first word in the ingredient list. ?Shopping ?Buy products labeled as "low-sodium" or "no salt added." ?Buy fresh foods. Avoid  canned foods and pre-made or frozen meals. ?Cooking ?Avoid adding salt when cooking. Use salt-free seasonings or herbs instead of table salt or sea salt. Check with your health care provider or pharmacist before using salt substitutes. ?Do not fry foods. Cook foods using healthy methods such as baking, boiling, grilling, roasting, and broiling instead. ?Cook with heart-healthy oils, such as olive, canola, avocado, soybean, or sunflower oil. ?Meal planning ? ?Eat a balanced diet that includes: ?4 or more servings of fruits and 4 or more servings of vegetables each day. Try to fill one-half of your plate with fruits and vegetables. ?6-8 servings of whole grains each day. ?Less than 6 oz (170 g) of lean meat, poultry, or fish each day. A 3-oz (85-g) serving of meat is about the same size as a deck of cards. One egg equals 1 oz (28 g). ?2-3 servings of low-fat dairy each day. One serving is 1 cup (237 mL). ?1 serving of nuts, seeds, or beans 5 times each week. ?2-3 servings of heart-healthy fats. Healthy fats called omega-3 fatty acids are found in foods such as walnuts, flaxseeds, fortified milks, and eggs. These fats are also found in cold-water fish, such as sardines, salmon, and mackerel. ?Limit how much you eat of: ?Canned or prepackaged foods. ?Food that is high in trans fat, such as some fried foods. ?Food that is high in saturated fat, such as fatty meat. ?Desserts and other sweets, sugary drinks, and other foods with added sugar. ?Full-fat dairy products. ?Do not salt foods  before eating. ?Do not eat more than 4 egg yolks a week. ?Try to eat at least 2 vegetarian meals a week. ?Eat more home-cooked food and less restaurant, buffet, and fast food. ?Lifestyle ?When eating at a restaurant, ask that your food be prepared with less salt or no salt, if possible. ?If you drink alcohol: ?Limit how much you use to: ?0-1 drink a day for women who are not pregnant. ?0-2 drinks a day for men. ?Be aware of how much alcohol  is in your drink. In the [place] one drink equals one 12 oz bottle of beer (355 mL), one 5 oz glass of wine (148 mL), or one 1? oz glass of hard liquor (44 mL). ?General information ?Avoid eating more than 2,300 mg of salt a day. If you have hypertension, you may need to reduce your sodium intake to 1,500 mg a day. ?Work with your health care provider to maintain a healthy body weight or to lose weight. Ask what an ideal weight is for you. ?Get at least 30 minutes of exercise that causes your heart to beat faster (aerobic exercise) most days of the week. Activities may include walking, swimming, or biking. ?Work with your health care provider or dietitian to adjust your eating plan to your individual calorie needs. ?What foods should I eat? ?Fruits ?All fresh, dried, or frozen fruit. Canned fruit in natural juice (without added sugar). ?Vegetables ?Fresh or frozen vegetables (raw, steamed, roasted, or grilled). Low-sodium or reduced-sodium tomato and vegetable juice. Low-sodium or reduced-sodium tomato sauce and tomato paste. Low-sodium or reduced-sodium canned vegetables. ?Grains ?Whole-grain or whole-wheat bread. Whole-grain or whole-wheat pasta. Brown rice. Orpah Cobbatmeal. Quinoa. Bulgur. Whole-grain and low-sodium cereals. Pita bread. Low-fat, low-sodium crackers. Whole-wheat flour tortillas. ?Meats and other proteins ?Skinless chicken or Malawiturkey. Ground chicken or Mal[place]iturkey. Pork with fat trimmed off. Fish and seafood. Egg whites. Dried beans, peas, or lentils. Unsalted nuts, nut butters, and seeds. Unsalted canned beans. Lean cuts of beef with fat trimmed off. Low-sodium, lean precooked or cured meat, such as sausages or meat loaves. ?Dairy ?Low-fat (1%) or fat-free (skim) milk. Reduced-fat, low-fat, or fat-free cheeses. Nonfat, low-sodium ricotta or cottage cheese. Low-fat or nonfat yogurt. Low-fat, low-sodium cheese. ?Fats and oils ?Soft margarine without trans fats. Vegetable oil. Reduced-fat, low-fat, or light  mayonnaise and salad dressings (reduced-sodium). Canola, safflower, olive, avocado, soybean, and sunflower oils. Avocado. ?Seasonings and condiments ?Herbs. Spices. Seasoning mixes without salt. ?Other foods ?Unsalted popcorn and pretzels. Fat-free sweets. ?The items listed above may not be a complete list of foods and beverages you can eat. Contact a dietitian for more information. ?What foods should I avoid? ?Fruits ?Canned fruit in a light or heavy syrup. Fried fruit. Fruit in cream or butter sauce. ?Vegetables ?Creamed or fried vegetables. Vegetables in a cheese sauce. Regular canned vegetables (not low-sodium or reduced-sodium). Regular canned tomato sauce and paste (not low-sodium or reduced-sodium). Regular tomato and vegetable juice (not low-sodium or reduced-sodium). Rosita FirePickles. Olives. ?Grains ?Baked goods made with fat, such as croissants, muffins, or some breads. Dry pasta or rice meal packs. ?Meats and other proteins ?Fatty cuts of meat. Ribs. Fried meat. Tomasa BlaseBacon. Bologna, salami, and other precooked or cured meats, such as sausages or meat loaves. Fat from the back of a pig (fatback). Bratwurst. Salted nuts and seeds. Canned beans with added salt. Canned or smoked fish. Whole eggs or egg yolks. Chicken or Malawiturkey with skin. ?Dairy ?Whole or 2% milk, cream, and half-and-half. Whole or full-fat cream cheese. Whole-fat or sweetened  yogurt. Full-fat cheese. Nondairy creamers. Whipped toppings. Processed cheese and cheese spreads. ?Fats and oils ?Butter. Stick margarine. Lard. Shortening. Ghee. Bacon fat. Tropical oils, such as coconut, palm kernel, or palm oil. ?Seasonings and condiments ?Onion salt, garlic salt, seasoned salt, table salt, and sea salt. Worcestershire sauce. Tartar sauce. Barbecue sauce. Teriyaki sauce. Soy sauce, including reduced-sodium. Steak sauce. Canned and packaged gravies. Fish sauce. Oyster sauce. Cocktail sauce. Store-bought horseradish. Ketchup. Mustard. Meat flavorings and  tenderizers. Bouillon cubes. Hot sauces. Pre-made or packaged marinades. Pre-made or packaged taco seasonings. Relishes. Regular salad dressings. ?Other foods ?Salted popcorn and pretzels. ?The items listed above may not

## 2022-06-10 IMAGING — CT CT HEART MORP W/ CTA COR W/ SCORE W/ CA W/CM &/OR W/O CM
4 of 7 series · 8 of 20 positions shown, 9 images · IV contrast (APPLIED)
Comparison: Chest CTA 07/08/2021.
COMPARISON: Chest CTA 07/08/2021.

Addendum:
EXAM:
OVER-READ INTERPRETATION  CT CHEST

The following report is an over-read performed by radiologist Dr.
Clybeen Rasoamalala [[email or web address]] on 10/14/2021. This
over-read does not include interpretation of cardiac or coronary
anatomy or pathology. The coronary calcium score/coronary CTA
interpretation by the cardiologist is attached.
CLINICAL DATA: This is a 69 year old male with chest pain. Medical
history includes hypertension and hyperlipidemia.
Cardiac/Coronary  CTA
TECHNIQUE: The patient was scanned on a [employer] Force scanner.

[Series 6: ts diast sharp · axial · 0.39mm/px · z∈[+1262,+1298]mm · 2 of 271 slices shown]
[im 91/271  lung]
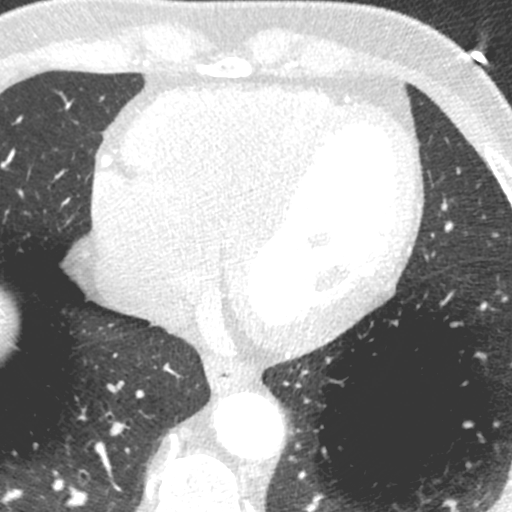
[im 181/271  lung]
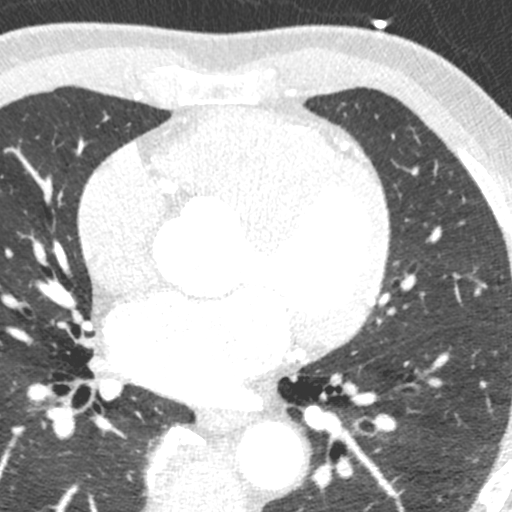

[Series 7: ts syst sharp · axial · 0.39mm/px · z∈[+1262,+1298]mm · 2 of 271 slices shown]
[im 91/271  lung]
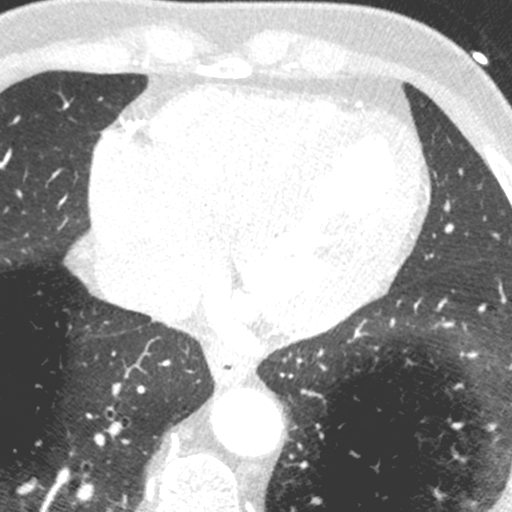
[im 181/271  lung]
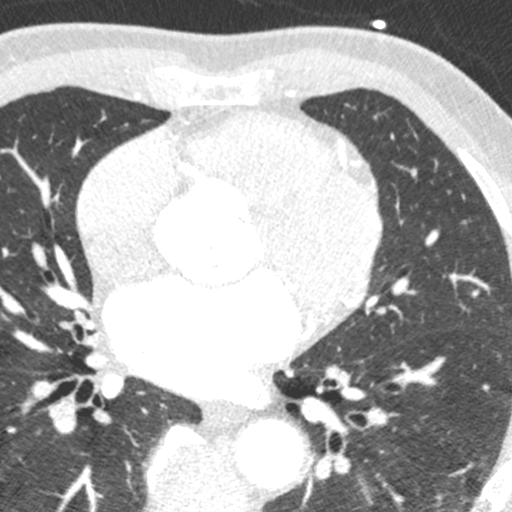

[Series 8: best syst · axial · 0.39mm/px · z∈[+1262,+1298]mm · 2 of 271 slices shown, 3 images]
[im 91/271  vessel]
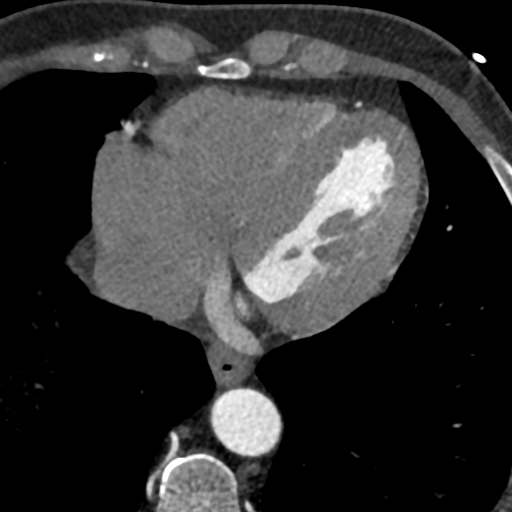
[im 91/271  lung]
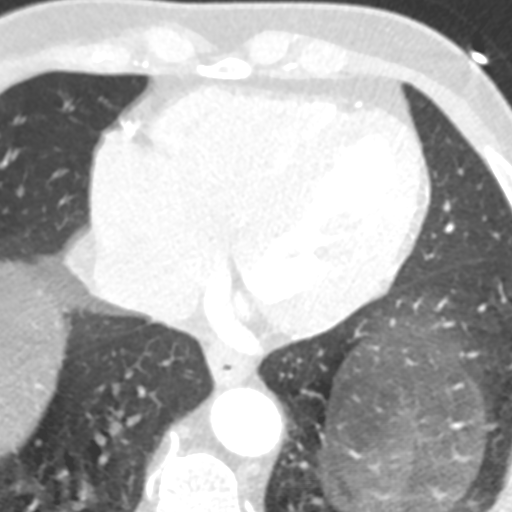
[im 181/271  vessel]
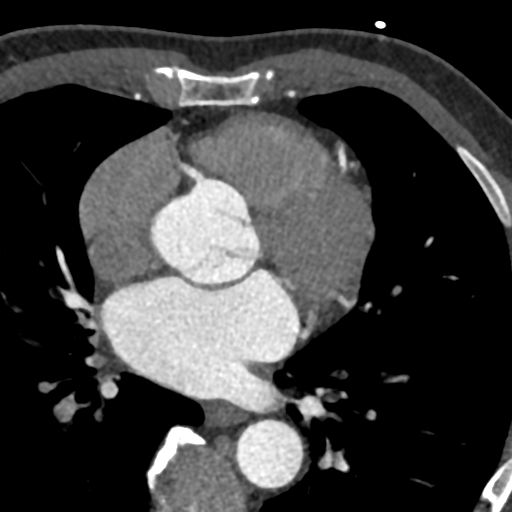

[Series 9: best diast · axial · 0.39mm/px · z∈[+1262,+1298]mm · 2 of 271 slices shown]
[im 91/271  vessel]
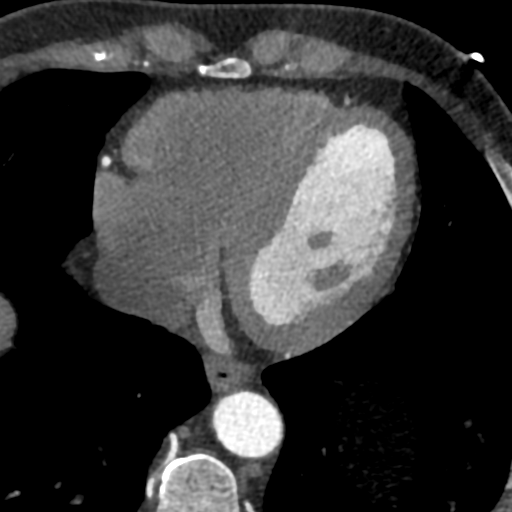
[im 181/271  vessel]
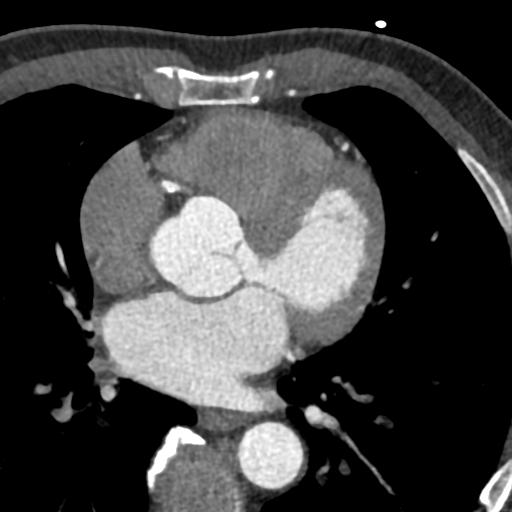

[8 of 20 positions shown; findings below may reference images not displayed]

FINDINGS: Series 9 images 98-108 demonstrate a filling defect within a
subsegmental sized pulmonary arterial branch to the right lower
lobe, compatible with pulmonary embolism. Tiny calcified granuloma
in the right lower lobe. Within the visualized portions of the
thorax there are no suspicious appearing pulmonary nodules or
masses, there is no acute consolidative airspace disease, no pleural
effusions, no pneumothorax and no lymphadenopathy. Visualized
portions of the upper abdomen are unremarkable. There are no
aggressive appearing lytic or blastic lesions noted in the
visualized portions of the skeleton.
IMPRESSION: 1. Study is positive for subsegmental sized pulmonary embolus to the
right lower lobe.

These results will be called to the ordering clinician or
representative by the Radiologist Assistant, and communication
documented in the PACS or [REDACTED].
FINDINGS: A 100 kV prospective scan was triggered in the descending thoracic
aorta at 111 HU's. Axial non-contrast 3 mm slices were carried out
through the heart. The data set was analyzed on a dedicated work
station and scored using the Agatson method. Gantry rotation speed
was 250 msecs and collimation was .6 mm. No beta blockade and 0.8 mg
of sl NTG was given. The 3D data set was reconstructed in 5%
intervals of the 67-82 % of the R-R cycle. Diastolic phases were
analyzed on a dedicated work station using MPR, MIP and VRT modes.
The patient received 80 cc of contrast.

Aorta: Normal size (35 mm).  No calcifications.  No dissection.

Aortic Valve:  Trileaflet.  No calcifications.

Coronary Arteries:  Normal coronary origin.  Right dominance.

RCA is a large dominant artery that gives rise to PDA and PLA. There
is minimal (<24%) diffuse calcified plaques in the proximal to mid
RCA. In the distal RCA right above the bifurcation there is a
minimal calcified plaque.

Left main is a large artery that gives rise to LAD and LCX arteries.
There is mild calcified plaque in the distal left main.

LAD is a large type 3 vessel. The proximal LAD with mild (25-49%)
calcified plaques. There is mild calcified plaque in the mid LAD.
The mid to distal LAD with diffuse minimal calcified plaques. D1 and
D2 as small caliber vessels with no plaque. D3 is a small caliber
vessel with minimal calcification mid vessel.

LCX is a non-dominant artery that gives rise to one large OM1
branch. There is no plaque in the LCX. The OM1 vessel with mild
calcified plaque in the proximal portion. The mid and distal portion
with no plaques.

Coronary Calcium Score:

Left main: 0

Left anterior descending artery: 467

Left circumflex artery: 116

Right coronary artery: 317

Total: 901

Percentile: 94

Other findings:

Normal pulmonary vein drainage into the left atrium.

Normal left atrial appendage without a thrombus.

Normal size of the pulmonary artery.
IMPRESSION: 1. Coronary calcium score of 901. This was 94 percentile for age and
sex matched control.

2. Normal coronary origin with right dominance.

3. No evidence of CAD. CAD-RADS 2. Mild non-obstructive CAD
(25-49%). Consider non-atherosclerotic causes of chest pain.
Consider preventive therapy and risk factor modification.

*** End of Addendum ***
EXAM:
OVER-READ INTERPRETATION  CT CHEST

The following report is an over-read performed by radiologist Dr.
Clybeen Rasoamalala [REDACTED] on 10/14/2021. This
over-read does not include interpretation of cardiac or coronary
anatomy or pathology. The coronary calcium score/coronary CTA
interpretation by the cardiologist is attached.
FINDINGS: Series 9 images 98-108 demonstrate a filling defect within a
subsegmental sized pulmonary arterial branch to the right lower
lobe, compatible with pulmonary embolism. Tiny calcified granuloma
in the right lower lobe. Within the visualized portions of the
thorax there are no suspicious appearing pulmonary nodules or
masses, there is no acute consolidative airspace disease, no pleural
effusions, no pneumothorax and no lymphadenopathy. Visualized
portions of the upper abdomen are unremarkable. There are no
aggressive appearing lytic or blastic lesions noted in the
visualized portions of the skeleton.
IMPRESSION: 1. Study is positive for subsegmental sized pulmonary embolus to the
right lower lobe.

These results will be called to the ordering clinician or
representative by the Radiologist Assistant, and communication
documented in the PACS or [REDACTED].

## 2022-07-20 ENCOUNTER — Ambulatory Visit (HOSPITAL_COMMUNITY)
Admission: EM | Admit: 2022-07-20 | Discharge: 2022-07-20 | Disposition: A | Discharge status: Home / Self Care | Payer: Medicare PPO

## 2022-07-20 ENCOUNTER — Encounter (HOSPITAL_COMMUNITY): Payer: Self-pay | Admitting: *Deleted

## 2022-07-20 DIAGNOSIS — E785 Hyperlipidemia, unspecified: Secondary | ICD-10-CM

## 2022-07-20 DIAGNOSIS — N529 Male erectile dysfunction, unspecified: Secondary | ICD-10-CM

## 2022-07-20 DIAGNOSIS — Z760 Encounter for issue of repeat prescription: Secondary | ICD-10-CM

## 2022-07-20 DIAGNOSIS — I10 Essential (primary) hypertension: Secondary | ICD-10-CM | POA: Diagnosis not present

## 2022-07-20 MED ORDER — LOSARTAN POTASSIUM 100 MG PO TABS: 100.0000 mg | ORAL_TABLET | Freq: Every day | ORAL | 2 refills | Status: DC

## 2022-07-20 MED ORDER — ROSUVASTATIN CALCIUM 10 MG PO TABS: 10.0000 mg | ORAL_TABLET | Freq: Every day | ORAL | 0 refills | Status: DC

## 2022-07-20 MED ORDER — SILDENAFIL CITRATE 100 MG PO TABS: 100.0000 mg | ORAL_TABLET | Freq: Every day | ORAL | 0 refills | Status: AC | PRN

## 2022-07-20 NOTE — Discharge Instructions (Signed)
Your Viagra has been refilled for 10 tablets  Your losartan has been refilled for 30-day supply with 2 refills  your rosuvastatin has been refilled for a 3 month supply  Please continue to take as directed  Please keep upcoming appointment with primary doctor for further evaluation and management

## 2022-07-20 NOTE — ED Triage Notes (Signed)
Patient needs a refill of Losartan and Crestor has a PCP appt set up for 10/3 but he needs refills until his appt. Does have a cough he has had for a couple months.

## 2022-07-20 NOTE — ED Provider Notes (Signed)
[facility]    CSN: [id number] Arrival date & time: 07/20/22  1315      History   Chief Complaint No chief complaint on file.   HPI Max Perez is a 71 y.o. male.   Patient presents requesting refill for Viagra, losartan and rosuvastatin.  Has upcoming appointment with primary doctor to establish care on September 08, 2022.  Endorses that he has been taking medicine daily as directed without missing dosages but will be running out within the next 1 to 2 weeks.  Denies shortness of breath, dizziness, chest pain, lightheadedness.  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 Authorizing Provider  aspirin EC 81 MG tablet Take 81 mg by mouth daily. Swallow whole.   Yes [provider]  cholecalciferol (VITAMIN D3) 25 MCG (1000 UNIT) tablet Take 1,000 Units by mouth daily. Take 1 tablet by mouth once daily   Yes [provider]  apixaban (ELIQUIS) 5 MG TABS tablet Take 5 mg by mouth daily.    [provider]  losartan (COZAAR) 100 MG tablet Take 1 tablet (100 mg total) by mouth daily. Take 1 tablet by mouth once daily 07/20/22   Valinda Hoar, NP  rosuvastatin (CRESTOR) 10 MG tablet Take 1 tablet (10 mg total) by mouth daily. 07/20/22   Bengie Kaucher Trombetta, Elita Boone, NP  sildenafil (VIAGRA) 100 MG tablet Take 1 tablet (100 mg total) by mouth daily as needed for erectile dysfunction.  07/20/22   Valinda Hoar, NP    Family History Family History  Problem Relation Age of Onset   Lung cancer Sister        smoker   Heart disease Sister        hole in heart   Stroke Father 7   Diabetes Neg Hx    Heart attack Neg Hx     Social History Social History   Tobacco Use   Smoking status: Never   Smokeless tobacco: Never  Vaping Use   Vaping Use: Never used  Substance Use Topics   Alcohol use: No   Drug use: No     Allergies   Patient has no known allergies.   Review of Systems Review of Systems  Constitutional: Negative.   HENT: Negative.    Respiratory: Negative.    Cardiovascular: Negative.   Genitourinary: Negative.   Skin: Negative.   Neurological: Negative.      Physical Exam Triage Vital Signs ED Triage Vitals [07/20/22 1425]  Enc Vitals Group     BP 135/89     Pulse Rate (!) 59     Resp 18     Temp 98.2 F (36.8 C)     Temp Source Oral     SpO2 96 %     Weight      Height      Head Circumference  Peak Flow      Pain Score      Pain Loc      Pain Edu?      Excl. in [place]?    No data found.  Updated Vital Signs BP 135/89 (BP Location: Right Arm)   Pulse (!) 59   Temp 98.2 F (36.8 C) (Oral)   Resp 18   SpO2 96%   Visual Acuity Right Eye Distance:   Left Eye Distance:   Bilateral Distance:    Right Eye Near:   Left Eye Near:    Bilateral Near:     Physical Exam Vitals and nursing note reviewed.  Constitutional:      General: He is not in acute distress.    Appearance: Normal appearance. He is well-developed.  HENT:     Head: Normocephalic and atraumatic.  Eyes:     Conjunctiva/sclera: Conjunctivae normal.  Cardiovascular:     Rate and Rhythm: Normal rate and regular rhythm.     Heart sounds: No murmur heard. Pulmonary:     Effort: Pulmonary effort is normal. No respiratory distress.     Breath sounds: Normal breath sounds.  Abdominal:     Palpations: Abdomen is soft.     Tenderness: There is no  abdominal tenderness.  Musculoskeletal:        General: No swelling.     Cervical back: Neck supple.  Skin:    General: Skin is warm and dry.     Capillary Refill: Capillary refill takes less than 2 seconds.  Neurological:     Mental Status: He is alert.  Psychiatric:        Mood and Affect: Mood normal.      UC Treatments / Results  Labs (all labs ordered are listed, but only abnormal results are displayed) Labs Reviewed - No data to display  EKG   Radiology No results found.  Procedures Procedures (including critical care time)  Medications Ordered in UC Medications - No data to display  Initial Impression / Assessment and Plan / UC Course  I have reviewed the triage vital signs and the nursing notes.  Pertinent labs & imaging results that were available during my care of the patient were reviewed by me and considered in my medical decision making (see chart for details).  Encounter for medication refill, hypertension, hyperlipidemia, erectile dysfunction  Medication refilled for 63-month supply, Viagra refill with 10 tablets to be used as needed, advised patient to continue to take as directed and strongly advised keeping upcoming PCP appointment for further evaluation and management, may follow-up with his urgent care as needed Final Clinical Impressions(s) / UC Diagnoses   Final diagnoses:  None   Discharge Instructions   None    ED Prescriptions     Medication Sig Dispense Auth. Provider   sildenafil (VIAGRA) 100 MG tablet Take 1 tablet (100 mg total) by mouth daily as needed for erectile dysfunction. 10 tablet Eban Weick, Hansel Starling R, NP   losartan (COZAAR) 100 MG tablet Take 1 tablet (100 mg total) by mouth daily. Take 1 tablet by mouth once daily 30 tablet Marguetta Windish R, NP   rosuvastatin (CRESTOR) 10 MG tablet Take 1 tablet (10 mg total) by mouth daily. 90 tablet Helyn Schwan, Elita Boone, NP      PDMP not reviewed this encounter.   Valinda Hoar,  NP 07/20/22 1448

## 2022-08-24 ENCOUNTER — Other Ambulatory Visit: Payer: Self-pay

## 2022-08-24 ENCOUNTER — Encounter (HOSPITAL_COMMUNITY): Payer: Self-pay | Admitting: *Deleted

## 2022-08-24 ENCOUNTER — Ambulatory Visit (HOSPITAL_COMMUNITY)
Admission: EM | Admit: 2022-08-24 | Discharge: 2022-08-24 | Disposition: A | Discharge status: Home / Self Care | Payer: Medicare PPO | Attending: Family Medicine | Admitting: Family Medicine

## 2022-08-24 ENCOUNTER — Ambulatory Visit (INDEPENDENT_AMBULATORY_CARE_PROVIDER_SITE_OTHER): Payer: Medicare PPO

## 2022-08-24 DIAGNOSIS — R059 Cough, unspecified: Secondary | ICD-10-CM

## 2022-08-24 DIAGNOSIS — Z20822 Contact with and (suspected) exposure to covid-19: Secondary | ICD-10-CM | POA: Diagnosis not present

## 2022-08-24 DIAGNOSIS — J069 Acute upper respiratory infection, unspecified: Secondary | ICD-10-CM | POA: Diagnosis present

## 2022-08-24 DIAGNOSIS — J029 Acute pharyngitis, unspecified: Secondary | ICD-10-CM | POA: Insufficient documentation

## 2022-08-24 HISTORY — DX: Essential (primary) hypertension: I10

## 2022-08-24 LAB — POCT RAPID STREP A, ED / UC: Streptococcus, Group A Screen (Direct): NEGATIVE

## 2022-08-24 LAB — RESP PANEL BY RT-PCR (RSV, FLU A&B, COVID)  RVPGX2
Influenza A by PCR: NEGATIVE
Influenza B by PCR: NEGATIVE
Resp Syncytial Virus by PCR: NEGATIVE
SARS Coronavirus 2 by RT PCR: NEGATIVE

## 2022-08-24 MED ORDER — BENZONATATE 100 MG PO CAPS: 100.0000 mg | ORAL_CAPSULE | Freq: Three times a day (TID) | ORAL | 0 refills | Status: DC | PRN

## 2022-08-24 NOTE — ED Provider Notes (Signed)
[place]    CSN: [id number] Arrival date & time: 08/24/22  0915      History   Chief Complaint Chief Complaint  Patient presents with   Cough   Sore Throat    HPI Tywon Niday is a 71 y.o. male.    Cough Sore Throat   Here for congestion and chills and sore throat that began in the last 2 days or so.  He noticed that nasal congestion starting last night.  He is not sure about fever but he has had chills.  No nausea or vomiting or diarrhea.  No shortness of breath.  He has had cough for about 2 weeks but then that worsened in the last 2 days.  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 Authorizing Provider  benzonatate (TESSALON) 100 MG capsule Take 1 capsule (100 mg total) by mouth 3 (three) times daily as needed for cough. 08/24/22  Yes Shalinda Burkholder, Gwenlyn Perking, MD  apixaban (ELIQUIS) 5 MG TABS tablet Take 5 mg by mouth daily.    [provider]  aspirin EC 81 MG tablet Take 81 mg by mouth daily. Swallow whole.    [provider]  cholecalciferol (VITAMIN D3) 25 MCG (1000 UNIT) tablet Take 1,000 Units by mouth daily. Take 1 tablet by mouth once daily    [provider]  losartan (COZAAR) 100 MG tablet  Take 1 tablet (100 mg total) by mouth daily. Take 1 tablet by mouth once daily 07/20/22   Hans Eden, NP  rosuvastatin (CRESTOR) 10 MG tablet Take 1 tablet (10 mg total) by mouth daily. 07/20/22   White, Leitha Schuller, NP  sildenafil (VIAGRA) 100 MG tablet Take 1 tablet (100 mg total) by mouth daily as needed for erectile dysfunction. 07/20/22   Hans Eden, NP    Family History Family History  Problem Relation Age of Onset   Lung cancer Sister        smoker   Heart disease Sister        hole in heart   Stroke Father 33   Diabetes Neg Hx    Heart attack Neg Hx     Social History Social History   Tobacco Use   Smoking status: Never   Smokeless tobacco: Never  Vaping Use   Vaping Use: Never used  Substance Use Topics   Alcohol use: No   Drug use: No     Allergies   Patient has no known allergies.   Review of Systems Review of Systems  Respiratory:  Positive for cough.      Physical Exam Triage Vital Signs ED Triage Vitals  Enc Vitals Group     BP  08/24/22 1034 (!) 170/94     Pulse Rate 08/24/22 1034 66     Resp 08/24/22 1034 18     Temp 08/24/22 1034 98 F (36.7 C)     Temp src --      SpO2 08/24/22 1034 98 %     Weight --      Height --      Head Circumference --      Peak Flow --      Pain Score 08/24/22 1031 8     Pain Loc --      Pain Edu? --      Excl. in [place]? --    No data found.  Updated Vital Signs BP (!) 170/94   Pulse 66   Temp 98 F (36.7 C)   Resp 18   SpO2 98%   Visual Acuity Right Eye Distance:   Left Eye Distance:   Bilateral Distance:    Right Eye Near:   Left Eye Near:    Bilateral Near:     Physical Exam Vitals reviewed.  Constitutional:      General: He is not in acute distress.    Appearance: He is not toxic-appearing.  HENT:     Right Ear: Tympanic membrane and ear canal normal.     Left Ear: Tympanic membrane and ear canal normal.     Nose: Nose normal.     Mouth/Throat:     Mouth: Mucous membranes are  moist.     Comments: There is some white and clear mucus draining in the oropharynx and there is some mild erythema of both tonsils.  Tonsils are hypertrophied 1+.  No asymmetry Eyes:     Extraocular Movements: Extraocular movements intact.     Conjunctiva/sclera: Conjunctivae normal.     Pupils: Pupils are equal, round, and reactive to light.  Cardiovascular:     Rate and Rhythm: Normal rate and regular rhythm.     Heart sounds: No murmur heard. Pulmonary:     Effort: No respiratory distress.     Breath sounds: No stridor. No wheezing, rhonchi or rales.  Musculoskeletal:     Cervical back: Neck supple.  Lymphadenopathy:     Cervical: No cervical adenopathy.  Skin:    Capillary Refill: Capillary refill takes less than 2 seconds.     Coloration: Skin is not jaundiced or pale.  Neurological:     General: No focal deficit present.     Mental Status: He is alert and oriented to person, place, and time.  Psychiatric:        Behavior: Behavior normal.      UC Treatments / Results  Labs (all labs ordered are listed, but only abnormal results are displayed) Labs Reviewed  CULTURE, GROUP A STREP ([place])  RESP PANEL BY RT-PCR (RSV, FLU A&B, COVID)  [id number]  POCT RAPID STREP A, ED / UC    EKG   Radiology DG Chest 2 View  Result Date: 08/24/2022 CLINICAL DATA:  71 year old male presents with cough for 2 weeks. EXAM: CHEST - 2 VIEW COMPARISON:  July 08, 2021 FINDINGS: The heart size and mediastinal contours are within normal limits. Both lungs are clear. The visualized skeletal structures are unremarkable. IMPRESSION: No active cardiopulmonary disease. Electronically Signed   By: Zetta Bills M.D.   On: 08/24/2022 11:28    Procedures Procedures (including critical care time)  Medications Ordered in UC Medications - No data to display  Initial Impression / Assessment and Plan / UC  Course  I have reviewed the triage vital signs and the nursing notes.  Pertinent labs & imaging  results that were available during my care of the patient were reviewed by me and considered in my medical decision making (see chart for details).    Strep is negative so we will send culture and notify patient and treat per protocol if positive. Chest x-ray is clear.  We will swab for COVID and flu and notify him of any positive results.  If COVID is positive, he should have a prescription for Paxlovid.  His EGFR was over 60 when last done.  If flu is positive, symptoms started on September 16, so Tamiflu could be prescribed if we get results back by tomorrow, September 19.      Final Clinical Impressions(s) / UC Diagnoses   Final diagnoses:  Viral upper respiratory tract infection  Sore throat     Discharge Instructions      Your strep test is negative.  Culture of the throat will be sent, and staff will notify you if that is in turn positive.  Your chest x-ray was clear.  Take benzonatate 100 mg, 1 tab every 8 hours as needed for cough.  Coricidin HBP would be safe to take for your congestion   You have been swabbed for COVID/flu/RSV, and the test will result in the next 24 hours. Our staff will call you if positive. If the COVID test is positive, you should quarantine for 5 days from the start of your symptoms      ED Prescriptions     Medication Sig Dispense Auth. Provider   benzonatate (TESSALON) 100 MG capsule Take 1 capsule (100 mg total) by mouth 3 (three) times daily as needed for cough. 21 capsule Barrett Henle, MD      PDMP not reviewed this encounter.   Barrett Henle, MD 08/24/22 [phone]

## 2022-08-24 NOTE — ED Triage Notes (Signed)
Pt has had a cough and fever.

## 2022-08-24 NOTE — Discharge Instructions (Addendum)
Your strep test is negative.  Culture of the throat will be sent, and staff will notify you if that is in turn positive.  Your chest x-ray was clear.  Take benzonatate 100 mg, 1 tab every 8 hours as needed for cough.  Coricidin HBP would be safe to take for your congestion   You have been swabbed for COVID/flu/RSV, and the test will result in the next 24 hours. Our staff will call you if positive. If the COVID test is positive, you should quarantine for 5 days from the start of your symptoms

## 2022-08-26 LAB — CULTURE, GROUP A STREP (THRC)

## 2022-09-24 ENCOUNTER — Encounter: Payer: Self-pay | Admitting: Internal Medicine

## 2022-09-24 ENCOUNTER — Ambulatory Visit: Payer: Medicare PPO | Admitting: Internal Medicine

## 2022-09-24 VITALS — BP 184/88 | HR 57 | Temp 98.4°F | Ht 68.0 in | Wt 182.0 lb

## 2022-09-24 DIAGNOSIS — Z0001 Encounter for general adult medical examination with abnormal findings: Secondary | ICD-10-CM | POA: Diagnosis not present

## 2022-09-24 DIAGNOSIS — Z125 Encounter for screening for malignant neoplasm of prostate: Secondary | ICD-10-CM | POA: Insufficient documentation

## 2022-09-24 DIAGNOSIS — K635 Polyp of colon: Secondary | ICD-10-CM | POA: Diagnosis not present

## 2022-09-24 DIAGNOSIS — N400 Enlarged prostate without lower urinary tract symptoms: Secondary | ICD-10-CM

## 2022-09-24 DIAGNOSIS — R739 Hyperglycemia, unspecified: Secondary | ICD-10-CM

## 2022-09-24 DIAGNOSIS — E785 Hyperlipidemia, unspecified: Secondary | ICD-10-CM | POA: Insufficient documentation

## 2022-09-24 DIAGNOSIS — I10 Essential (primary) hypertension: Secondary | ICD-10-CM | POA: Diagnosis not present

## 2022-09-24 LAB — BASIC METABOLIC PANEL
BUN: 17 mg/dL (ref 6–23)
CO2: 29 mEq/L (ref 19–32)
Calcium: 10.1 mg/dL (ref 8.4–10.5)
Chloride: 105 mEq/L (ref 96–112)
Creatinine, Ser: 1.1 mg/dL (ref 0.40–1.50)
GFR: 67.35 mL/min (ref >60.00)
Glucose, Bld: 86 mg/dL (ref 70–99)
Potassium: 4.1 mEq/L (ref 3.5–5.1)
Sodium: 141 mEq/L (ref 135–145)

## 2022-09-24 LAB — CBC WITH DIFFERENTIAL/PLATELET
Basophils Absolute: 0 10*3/uL (ref 0.0–0.1)
Basophils Relative: 0.8 % (ref 0.0–3.0)
Eosinophils Absolute: 0.2 10*3/uL (ref 0.0–0.7)
Eosinophils Relative: 2.8 % (ref 0.0–5.0)
HCT: 42.3 % (ref 39.0–52.0)
Hemoglobin: 14.3 g/dL (ref 13.0–17.0)
Lymphocytes Relative: 34.4 % (ref 12.0–46.0)
Lymphs Abs: 1.9 10*3/uL (ref 0.7–4.0)
MCHC: 33.7 g/dL (ref 30.0–36.0)
MCV: 86.9 fl (ref 78.0–100.0)
Monocytes Absolute: 0.3 10*3/uL (ref 0.1–1.0)
Monocytes Relative: 6.2 % (ref 3.0–12.0)
Neutro Abs: 3.1 10*3/uL (ref 1.4–7.7)
Neutrophils Relative %: 55.8 % (ref 43.0–77.0)
Platelets: 233 10*3/uL (ref 150.0–400.0)
RBC: 4.86 Mil/uL (ref 4.22–5.81)
RDW: 13.7 % (ref 11.5–15.5)
WBC: 5.5 10*3/uL (ref 4.0–10.5)

## 2022-09-24 LAB — URINALYSIS, ROUTINE W REFLEX MICROSCOPIC
Bilirubin Urine: NEGATIVE
Hgb urine dipstick: NEGATIVE
Ketones, ur: NEGATIVE
Leukocytes,Ua: NEGATIVE
Nitrite: NEGATIVE
RBC / HPF: NONE SEEN (ref 0–?)
Specific Gravity, Urine: <=1.005 — AB (ref 1.000–1.030)
Total Protein, Urine: NEGATIVE
Urine Glucose: NEGATIVE
Urobilinogen, UA: 0.2 (ref 0.0–1.0)
WBC, UA: NONE SEEN (ref 0–?)
pH: 6.5 (ref 5.0–8.0)

## 2022-09-24 LAB — PSA: PSA: 0.3 ng/mL (ref 0.10–4.00)

## 2022-09-24 LAB — HEPATIC FUNCTION PANEL
ALT: 19 U/L (ref 0–53)
AST: 18 U/L (ref 0–37)
Albumin: 4.8 g/dL (ref 3.5–5.2)
Alkaline Phosphatase: 56 U/L (ref 39–117)
Bilirubin, Direct: 0.2 mg/dL (ref 0.0–0.3)
Total Bilirubin: 0.9 mg/dL (ref 0.2–1.2)
Total Protein: 7.6 g/dL (ref 6.0–8.3)

## 2022-09-24 LAB — TSH: TSH: 1.48 u[IU]/mL (ref 0.35–5.50)

## 2022-09-24 LAB — HEMOGLOBIN A1C: Hgb A1c MFr Bld: 5.8 % (ref 4.6–6.5)

## 2022-09-24 MED ORDER — ROSUVASTATIN CALCIUM 10 MG PO TABS: 10.0000 mg | ORAL_TABLET | Freq: Every day | ORAL | 0 refills | Status: AC

## 2022-09-24 MED ORDER — BOOSTRIX 5-2.5-18.5 LF-MCG/0.5 IM SUSP: 0.5000 mL | Freq: Once | INTRAMUSCULAR | 0 refills | Status: AC

## 2022-09-24 MED ORDER — SHINGRIX 50 MCG/0.5ML IM SUSR: 0.5000 mL | Freq: Once | INTRAMUSCULAR | 1 refills | Status: AC

## 2022-09-24 NOTE — Patient Instructions (Signed)
Health Maintenance, Male Adopting a healthy lifestyle and getting preventive care are important in promoting health and wellness. Ask your health care provider about: The right schedule for you to have regular tests and exams. Things you can do on your own to prevent diseases and keep yourself healthy. What should I know about diet, weight, and exercise? Eat a healthy diet  Eat a diet that includes plenty of vegetables, fruits, low-fat dairy products, and lean protein. Do not eat a lot of foods that are high in solid fats, added sugars, or sodium. Maintain a healthy weight Body mass index (BMI) is a measurement that can be used to identify possible weight problems. It estimates body fat based on height and weight. Your health care provider can help determine your BMI and help you achieve or maintain a healthy weight. Get regular exercise Get regular exercise. This is one of the most important things you can do for your health. Most adults should: Exercise for at least 150 minutes each week. The exercise should increase your heart rate and make you sweat (moderate-intensity exercise). Do strengthening exercises at least twice a week. This is in addition to the moderate-intensity exercise. Spend less time sitting. Even light physical activity can be beneficial. Watch cholesterol and blood lipids Have your blood tested for lipids and cholesterol at 72 years of age, then have this test every 5 years. You may need to have your cholesterol levels checked more often if: Your lipid or cholesterol levels are high. You are older than 72 years of age. You are at high risk for heart disease. What should I know about cancer screening? Many types of cancers can be detected early and may often be prevented. Depending on your health history and family history, you may need to have cancer screening at various ages. This may include screening for: Colorectal cancer. Prostate cancer. Skin cancer. Lung  cancer. What should I know about heart disease, diabetes, and high blood pressure? Blood pressure and heart disease High blood pressure causes heart disease and increases the risk of stroke. This is more likely to develop in people who have high blood pressure readings or are overweight. Talk with your health care provider about your target blood pressure readings. Have your blood pressure checked: Every 3-5 years if you are 18-39 years of age. Every year if you are 40 years old or older. If you are between the ages of 65 and 75 and are a current or former smoker, ask your health care provider if you should have a one-time screening for abdominal aortic aneurysm (AAA). Diabetes Have regular diabetes screenings. This checks your fasting blood sugar level. Have the screening done: Once every three years after age 45 if you are at a normal weight and have a low risk for diabetes. More often and at a younger age if you are overweight or have a high risk for diabetes. What should I know about preventing infection? Hepatitis B If you have a higher risk for hepatitis B, you should be screened for this virus. Talk with your health care provider to find out if you are at risk for hepatitis B infection. Hepatitis C Blood testing is recommended for: Everyone born from 1945 through 1965. Anyone with known risk factors for hepatitis C. Sexually transmitted infections (STIs) You should be screened each year for STIs, including gonorrhea and chlamydia, if: You are sexually active and are younger than 72 years of age. You are older than 72 years of age and your   health care provider tells you that you are at risk for this type of infection. Your sexual activity has changed since you were last screened, and you are at increased risk for chlamydia or gonorrhea. Ask your health care provider if you are at risk. Ask your health care provider about whether you are at high risk for HIV. Your health care provider  may recommend a prescription medicine to help prevent HIV infection. If you choose to take medicine to prevent HIV, you should first get tested for HIV. You should then be tested every 3 months for as long as you are taking the medicine. Follow these instructions at home: Alcohol use Do not drink alcohol if your health care provider tells you not to drink. If you drink alcohol: Limit how much you have to 0-2 drinks a day. Know how much alcohol is in your drink. In the [place] one drink equals one 12 oz bottle of beer (355 mL), one 5 oz glass of wine (148 mL), or one 1 oz glass of hard liquor (44 mL). Lifestyle Do not use any products that contain nicotine or tobacco. These products include cigarettes, chewing tobacco, and vaping devices, such as e-cigarettes. If you need help quitting, ask your health care provider. Do not use street drugs. Do not share needles. Ask your health care provider for help if you need support or information about quitting drugs. General instructions Schedule regular health, dental, and eye exams. Stay current with your vaccines. Tell your health care provider if: You often feel depressed. You have ever been abused or do not feel safe at home. Summary Adopting a healthy lifestyle and getting preventive care are important in promoting health and wellness. Follow your health care provider's instructions about healthy diet, exercising, and getting tested or screened for diseases. Follow your health care provider's instructions on monitoring your cholesterol and blood pressure. This information is not intended to replace advice given to you by your health care provider. Make sure you discuss any questions you have with your health care provider. Document Revised: 04/14/2021 Document Reviewed: 04/14/2021 Elsevier Patient Education  2023 Elsevier Inc.  

## 2022-09-24 NOTE — Progress Notes (Signed)
Subjective:  Patient ID: Max Perez, male    DOB: March 07, 1951  Age: 71 y.o. MRN: [id number]  CC: Annual Exam, Hypertension, and Hyperlipidemia   HPI Max Perez presents for a CPX and to establish.  He is active and denies chest pain, shortness of breath, diaphoresis, dizziness, lightheadedness, edema, or headache.  History Max Perez has a past medical history of Chest pain, Elevated blood pressure reading, Elevated BP, Hypercalcemia, Hyperlipidemia, Hyperplastic colonic polyp, Hypertension, and PE (pulmonary thromboembolism) ([place]) (10/07/2021).   He has a past surgical history that includes Colonoscopy (2010).   His family history includes Alcohol abuse in his father and sister; Asthma in his son; Depression in his son; Diabetes in his son; Heart disease in his sister; Hypertension in his mother, sister, and son; Lung cancer in his sister; Stroke (age of onset: 51) in his father.He reports that he has never smoked. He has never used smokeless tobacco. He reports that he does not drink alcohol and does not use drugs.  Outpatient Medications Prior to Visit  Medication Sig Dispense Refill   aspirin EC 81 MG tablet Take 81 mg by mouth daily. Swallow whole.     cholecalciferol (VITAMIN D3) 25 MCG (1000 UNIT) tablet Take 1,000 Units by mouth daily. Take 1 tablet by mouth once daily     sildenafil (VIAGRA) 100 MG tablet Take 1 tablet (100 mg total) by mouth daily as needed for erectile dysfunction. 10 tablet 0   apixaban (ELIQUIS) 5 MG TABS tablet Take 5 mg by mouth daily.     benzonatate (TESSALON) 100 MG capsule Take 1 capsule (100 mg total) by mouth 3 (three) times daily as needed for cough. 21 capsule 0   losartan (COZAAR) 100 MG tablet Take 1 tablet (100 mg total) by mouth daily. Take 1 tablet by mouth once daily 30 tablet 2   rosuvastatin (CRESTOR) 10 MG tablet Take 1 tablet (10 mg total) by mouth daily. 90 tablet 0   No facility-administered medications prior to visit.     ROS Review of Systems  Constitutional:  Negative for chills, diaphoresis, fatigue and fever.  HENT: Negative.    Eyes: Negative.   Respiratory:  Negative for apnea, cough, chest tightness, shortness of breath and wheezing.   Cardiovascular:  Negative for chest pain, palpitations and leg swelling.  Gastrointestinal:  Negative for abdominal pain, constipation, diarrhea and vomiting.  Endocrine: Negative.   Genitourinary: Negative.  Negative for difficulty urinating.  Musculoskeletal:  Negative for arthralgias and myalgias.  Skin: Negative.   Neurological:  Negative for dizziness.  Hematological:  Negative for adenopathy. Does not bruise/bleed easily.  Psychiatric/Behavioral: Negative.      Objective:  BP (!) 184/88   Pulse (!) 57   Temp 98.4 F (36.9 C) (Oral)   Ht 5\' 8"  (1.727 m)   Wt 182 lb (82.6 kg)   SpO2 98%   BMI 27.67 kg/m   Physical Exam Vitals reviewed. Exam conducted with a chaperone present (his wife).  Constitutional:      Appearance: He is not ill-appearing.  HENT:     Nose: Nose normal.     Mouth/Throat:     Mouth: Mucous membranes are moist.  Eyes:     General: No scleral icterus.    Conjunctiva/sclera: Conjunctivae normal.  Cardiovascular:     Rate and Rhythm: Regular rhythm. Bradycardia present.     Heart sounds: No murmur heard.    Comments: EKG- SB with 1st degree AV block, 55 bpm TWI in V1 -  old, V2- new; flat T wave in III -old No LVH or Q waves Pulmonary:     Effort: Pulmonary effort is normal.     Breath sounds: No stridor. No wheezing, rhonchi or rales.  Abdominal:     General: Abdomen is flat.     Palpations: There is no mass.     Tenderness: There is no abdominal tenderness. There is no guarding.     Hernia: No hernia is present. There is no hernia in the left inguinal area or right inguinal area.  Genitourinary:    Penis: Normal and uncircumcised.      Testes: Normal.     Epididymis:     Right: Normal.     Left: Normal.      Prostate: Enlarged. Not tender and no nodules present.     Rectum: Normal. Guaiac result negative. No mass, tenderness, anal fissure, external hemorrhoid or internal hemorrhoid. Normal anal tone.  Musculoskeletal:        General: No swelling.     Cervical back: Neck supple.     Right lower leg: No edema.     Left lower leg: No edema.  Lymphadenopathy:     Cervical: No cervical adenopathy.     Lower Body: No right inguinal adenopathy. No left inguinal adenopathy.  Skin:    General: Skin is warm and dry.  Neurological:     General: No focal deficit present.     Mental Status: He is alert. Mental status is at baseline.  Psychiatric:        Mood and Affect: Mood normal.        Behavior: Behavior normal.     Lab Results  Component Value Date   WBC 5.5 09/24/2022   HGB 14.3 09/24/2022   HCT 42.3 09/24/2022   PLT 233.0 09/24/2022   GLUCOSE 86 09/24/2022   CHOL 135 02/18/2022   TRIG 98 02/18/2022   HDL 45 02/18/2022   LDLDIRECT 164.7 07/26/2009   LDLCALC 72 02/18/2022   ALT 19 09/24/2022   AST 18 09/24/2022   NA 141 09/24/2022   K 4.1 09/24/2022   CL 105 09/24/2022   CREATININE 1.10 09/24/2022   BUN 17 09/24/2022   CO2 29 09/24/2022   TSH 1.48 09/24/2022   PSA 0.30 09/24/2022   HGBA1C 5.8 09/24/2022     Assessment & Plan:   Max Perez was seen today for annual exam, hypertension and hyperlipidemia.  Diagnoses and all orders for this visit:  Encounter for general adult medical examination with abnormal findings- Exam completed, labs reviewed, vaccines reviewed- today he refused a flu vaccine and pneumonia vaccine, cancer screenings addressed, patient education was given.  Primary hypertension- His blood pressure is not well controlled.  His EKG is negative for LVH.  Will check labs to screen for secondary causes and endorgan damage.  Will treat this with a combination of Dyazide and hydralazine. -     EKG 12-Lead -     Basic metabolic panel; Future -     CBC with  Differential/Platelet; Future -     TSH; Future -     Hepatic function panel; Future -     Hepatic function panel -     TSH -     CBC with Differential/Platelet -     Basic metabolic panel -     triamterene-hydrochlorothiazide (DYAZIDE) 37.5-25 MG capsule; Take 1 each (1 capsule total) by mouth daily. -     hydrALAZINE (APRESOLINE) 25 MG tablet; Take 1 tablet (  25 mg total) by mouth 3 (three) times daily.  Dyslipidemia, goal LDL below 100- LDL goal achieved. Doing well on the statin  -     rosuvastatin (CRESTOR) 10 MG tablet; Take 1 tablet (10 mg total) by mouth daily. -     TSH; Future -     Hepatic function panel; Future -     Hepatic function panel -     TSH  BPH without obstruction/lower urinary tract symptoms -     PSA; Future -     Urinalysis, Routine w reflex microscopic; Future -     Urinalysis, Routine w reflex microscopic -     PSA  Malignant hypertension -     Aldosterone + renin activity w/ ratio; Future -     Aldosterone + renin activity w/ ratio -     US Renal Artery Stenosis; Future -     triamterene-hydrochlorothiazide (DYAZIDE) 37.5-25 MG capsule; Take 1 each (1 capsule total) by mouth daily. -     hydrALAZINE (APRESOLINE) 25 MG tablet; Take 1 tablet (25 mg total) by mouth 3 (three) times daily.  Hyperplastic colonic polyp, unspecified part of colon -     Ambulatory referral to Gastroenterology  Chronic hyperglycemia -     Hemoglobin A1c; Future -     Hemoglobin A1c  Other orders -     Zoster Vaccine Adjuvanted [facility]) injection; Inject 0.5 mLs into the muscle once for 1 dose. -     Tdap (BOOSTRIX) 5-2.5-18.5 LF-MCG/0.5 injection; Inject 0.5 mLs into the muscle once for 1 dose.   I have discontinued Max Perez's apixaban, losartan, and benzonatate. I am also having him start on Shingrix, Boostrix, triamterene-hydrochlorothiazide, and hydrALAZINE. Additionally, I am having him maintain his cholecalciferol, aspirin EC, sildenafil, and  rosuvastatin.  Meds ordered this encounter  Medications   rosuvastatin (CRESTOR) 10 MG tablet    Sig: Take 1 tablet (10 mg total) by mouth daily.    Dispense:  90 tablet    Refill:  0   Zoster Vaccine Adjuvanted [facility]) injection    Sig: Inject 0.5 mLs into the muscle once for 1 dose.    Dispense:  0.5 mL    Refill:  1   Tdap (BOOSTRIX) 5-2.5-18.5 LF-MCG/0.5 injection    Sig: Inject 0.5 mLs into the muscle once for 1 dose.    Dispense:  0.5 mL    Refill:  0   triamterene-hydrochlorothiazide (DYAZIDE) 37.5-25 MG capsule    Sig: Take 1 each (1 capsule total) by mouth daily.    Dispense:  30 capsule    Refill:  0   hydrALAZINE (APRESOLINE) 25 MG tablet    Sig: Take 1 tablet (25 mg total) by mouth 3 (three) times daily.    Dispense:  90 tablet    Refill:  0     Follow-up: Return in about 6 weeks (around 11/05/2022).  Scarlette Calico, MD

## 2022-09-25 MED ORDER — TRIAMTERENE-HCTZ 37.5-25 MG PO CAPS: 1.0000 | ORAL_CAPSULE | Freq: Every day | ORAL | 0 refills | Status: DC

## 2022-09-25 MED ORDER — HYDRALAZINE HCL 25 MG PO TABS: 25.0000 mg | ORAL_TABLET | Freq: Three times a day (TID) | ORAL | 0 refills | Status: DC

## 2022-10-02 ENCOUNTER — Encounter: Payer: Self-pay | Admitting: Internal Medicine

## 2022-10-05 ENCOUNTER — Other Ambulatory Visit: Payer: Self-pay | Admitting: Internal Medicine

## 2022-10-05 ENCOUNTER — Encounter: Payer: Self-pay | Admitting: Internal Medicine

## 2022-10-05 DIAGNOSIS — I10 Essential (primary) hypertension: Secondary | ICD-10-CM

## 2022-10-06 LAB — ALDOSTERONE + RENIN ACTIVITY W/ RATIO
ALDO / PRA Ratio: 2.8 Ratio (ref 0.9–28.9)
Aldosterone: 2 ng/dL
Renin Activity: 0.72 ng/mL/h (ref 0.25–5.82)

## 2022-10-07 ENCOUNTER — Other Ambulatory Visit: Payer: Medicare PPO

## 2022-10-13 ENCOUNTER — Ambulatory Visit
Admission: RE | Admit: 2022-10-13 | Discharge: 2022-10-13 | Disposition: A | Discharge status: Home / Self Care | Payer: Medicare PPO | Source: Ambulatory Visit | Attending: Internal Medicine | Admitting: Internal Medicine

## 2022-10-13 DIAGNOSIS — I10 Essential (primary) hypertension: Secondary | ICD-10-CM

## 2022-10-14 ENCOUNTER — Other Ambulatory Visit: Payer: Self-pay | Admitting: Internal Medicine

## 2022-10-14 DIAGNOSIS — I10 Essential (primary) hypertension: Secondary | ICD-10-CM

## 2022-10-14 MED ORDER — LOSARTAN POTASSIUM 100 MG PO TABS: 100.0000 mg | ORAL_TABLET | Freq: Every day | ORAL | 0 refills | Status: AC

## 2022-12-22 ENCOUNTER — Encounter: Payer: Self-pay | Admitting: Cardiovascular Disease

## 2022-12-22 MED ORDER — APIXABAN 5 MG PO TABS: 5.0000 mg | ORAL_TABLET | Freq: Two times a day (BID) | ORAL | 5 refills | Status: DC

## 2022-12-22 MED ORDER — APIXABAN 2.5 MG PO TABS: 2.5000 mg | ORAL_TABLET | Freq: Two times a day (BID) | ORAL | 5 refills | Status: DC

## 2023-01-14 ENCOUNTER — Telehealth: Payer: Self-pay

## 2023-01-14 NOTE — Telephone Encounter (Signed)
Patient's spouse writing in with questions about alternatives to eliquis.   Patient and spouse also witCalled patient and spouse to assess for any symptoms ofPatient's spouse states blood pressures are trending lower (see mychart message), were in the 140's SBP now in 120's. Called patient and spouse (DPR) to ask if patient is having any symptoms such as lightheadedness or dizziness. Patient states he did when he first started eliquis but he has not had any lightheadedness or dizziness lately. He states he has been on losartan 100 mg for awhile and never had any problems before starting eliquis. Provided education that the lower trend in BP numbers may be a desired result if he is not having symptoms and that I would forward these questions to Dr. Acie Fredrickson and his nurse. Patient and spouse agree to plan.

## 2023-01-15 NOTE — Telephone Encounter (Signed)
Patient and spouse calling back. Reviewed Dr. Elmarie Shiley advice that the Eliquis is not causing his hypotension. If his BP is consistently low, Dr. Acie Fredrickson advises that patient may reduce his Losartan to 1/2 tab a day. Asked patient if he is he eating and drinking ok, patient and spouse endorse that patient drinks plenty of fluids and eats meals regularly. Per Dr. Elmarie Shiley request I scheduled patient for Monday, 01/18/23. Patient and spouse verbalize understanding of plan.

## 2023-01-17 ENCOUNTER — Encounter: Payer: Self-pay | Admitting: Cardiovascular Disease

## 2023-01-17 NOTE — Progress Notes (Unsigned)
Cardiology Office Note:    Date:  01/18/2023   ID:  Max Perez, DOB 1951-11-22, MRN [id number]  PCP:  Max Lima, MD   [facility] Providers Cardiologist:  Max Perez     Referring MD: Max Lima, MD   Chief Complaint  Patient presents with   Hypertension   Chest Pain   Hyperlipidemia    Problem LIst Pulmonary embolus ( possibly a COVID related )   HTN Mild CAD  Hyperlipidemia   History of Present Illness:    Max Perez is a 72 y.o. male with a hx of chest pain, HTN, HLD, pulmonary embolus  We were asked to see him for an episode of CP by Max Perez  3 months ago Sharp pain ,  lasted a few seconds Not associated with any palpitations, lightheadedness  He was previously exercising every day.  Has not been exercising since that time because of concern about this chest pain. Not associated with deep breath, no assoc with twisting or turing his torso He recalls having this pain on 2 different occasions.  Went to the ER  CT showed some coronary artery calcification  No aortic dissection .   Apr 06, 2022: We have is seen today for follow-up of his chest pain, hypertension, hyperlipidemia. Coronary CT angiogram performed in November, 2022 reveals Left main-normal LAD has mild to moderate plaque in the proximal vessel Left circumflex artery is small and nondominant. Right coronary artery is large and dominant.  There is mild irregularities in the proximal and mid RCA.  We started him on Crestor   Will give him a DASH diet  Has not exercised since his PE    Feb. 12, 2024  [name] is seen for follow up of his HTN, HLD, Has mild CAD by coronary CTA   BP readings at home are usually better  Is back on Eliquis  for his pulmonary embolus  Is avoiding salt   Has mild CAD ,  his last LDL is 72.   He does not necessarily want to be on Eliquis for the rest of his life.  He complains about the cost.  From my standpoint I suspect that his pulmonary  embolus was not unprovoked pulmonary embolus.  It did seem to correspond to a COVID infection.  I will refer him to hematology.  If they determined that this was a provoked pulmonary embolus in 1 year treatment is enough, then we may be able to safely discontinue the Eliquis.  I will defer that decision to hematology.    Past Medical History:  Diagnosis Date   Chest pain    Elevated blood pressure reading    Elevated BP    Hypercalcemia    Hyperlipidemia    Hyperplastic colonic polyp    Hypertension    PE (pulmonary thromboembolism) ([place]) 10/07/2021    Past Surgical History:  Procedure Laterality Date   COLONOSCOPY  2010   hyperplastic polyp    Current Medications: Current Meds  Medication Sig   apixaban (ELIQUIS) 5 MG TABS tablet Take 1 tablet (5 mg total) by mouth 2 (two) times daily.   cholecalciferol (VITAMIN D3) 25 MCG (1000 UNIT) tablet Take 1,000 Units by mouth daily. Take 1 tablet by mouth once daily   losartan (COZAAR) 100 MG tablet Take 1 tablet (100 mg total) by mouth daily.   rosuvastatin (CRESTOR) 10 MG tablet Take 1 tablet (10 mg total) by mouth daily.   sildenafil (VIAGRA) 100 MG tablet Take 1 tablet (  100 mg total) by mouth daily as needed for erectile dysfunction.     Allergies:   Patient has no known allergies.   Social History   Socioeconomic History   Marital status: Single    Spouse name: Not on file   Number of children: Not on file   Years of education: Not on file   Highest education level: Not on file  Occupational History   Not on file  Tobacco Use   Smoking status: Never   Smokeless tobacco: Never  Vaping Use   Vaping Use: Never used  Substance and Sexual Activity   Alcohol use: No   Drug use: No   Sexual activity: Yes    Partners: Female  Other Topics Concern   Not on file  Social History Narrative   Not on file   Social Determinants of Health   Financial Resource Strain: Not on file  Food Insecurity: Not on file   Transportation Needs: Not on file  Physical Activity: Not on file  Stress: Not on file  Social Connections: Not on file     Family History: The patient's family history includes Alcohol abuse in his father and sister; Asthma in his son; Depression in his son; Diabetes in his son; Heart disease in his sister; Hypertension in his mother, sister, and son; Lung cancer in his sister; Stroke (age of onset: 59) in his father. There is no history of Heart attack.  ROS:   Please see the history of present illness.     All other systems reviewed and are negative.  EKGs/Labs/Other Studies Reviewed:    The following studies were reviewed today:   EKG:    Recent Labs: 09/24/2022: ALT 19; BUN 17; Creatinine, Ser 1.10; Hemoglobin 14.3; Platelets 233.0; Potassium 4.1; Sodium 141; TSH 1.48  Recent Lipid Panel    Component Value Date/Time   CHOL 135 02/18/2022 0908   CHOL 199 09/27/2014 1027   TRIG 98 02/18/2022 0908   TRIG 63 09/27/2014 1027   HDL 45 02/18/2022 0908   HDL 45 09/27/2014 1027   CHOLHDL 3.0 02/18/2022 0908   CHOLHDL 5 07/26/2009 1004   VLDL 14.6 07/26/2009 1004   LDLCALC 72 02/18/2022 0908   LDLCALC 141 (H) 09/27/2014 1027   LDLDIRECT 164.7 07/26/2009 1004     Risk Assessment/Calculations:           Physical Exam:    Physical Exam: Blood pressure 138/88, pulse 73, height 5' 8"$  (1.727 m), weight 178 lb (80.7 kg), SpO2 99 %.       GEN:  Well nourished, well developed in no acute distress HEENT: Normal NECK: No JVD; No carotid bruits LYMPHATICS: No lymphadenopathy CARDIAC: RRR , no murmurs, rubs, gallops RESPIRATORY:  Clear to auscultation without rales, wheezing or rhonchi  ABDOMEN: Soft, non-tender, non-distended MUSCULOSKELETAL:  No edema; No deformity  SKIN: Warm and dry NEUROLOGIC:  Alert and oriented x 3   ASSESSMENT:    1. Pulmonary embolism, other, unspecified chronicity, unspecified whether acute cor pulmonale present ([place])   2. Primary  hypertension      PLAN:       Chest discomfort:   -    2.  Pulmonary embolus:  he is back on Eliquis .   He had stopped the Eliquis and was taking an ASA  I stressed the importance of Eliquis ( or a true anticoagulant)over ASA     He does not necessarily want to be on Eliquis for the rest of his life.  He complains about the cost.  From my standpoint I suspect that his pulmonary embolus was not unprovoked pulmonary embolus.  It did seem to correspond to a COVID infection.  I will refer him to hematology.  If they determined that this was a provoked pulmonary embolus in 1 year treatment is enough, then we may be able to safely discontinue the Eliquis.  I will defer that decision to hematology.   2.  HTN:   cont to watch salt,     Medication Adjustments/Labs and Tests Ordered: Current medicines are reviewed at length with the patient today.  Concerns regarding medicines are outlined above.  Orders Placed This Encounter  Procedures   Ambulatory referral to Hematology / Oncology   No orders of the defined types were placed in this encounter.   Patient Instructions  Medication Instructions:  Your physician recommends that you continue on your current medications as directed. Please refer to the Current Medication list given to you today.  *If you need a refill on your cardiac medications before your next appointment, please call your pharmacy*   Lab Work: NONE If you have labs (blood work) drawn today and your tests are completely normal, you will receive your results only by: [place] (if you have MyChart) OR A paper copy in the mail If you have any lab test that is abnormal or we need to change your treatment, we will call you to review the results.   Testing/Procedures: Ambulatory Referral to Hemotology   Follow-Up: At [facility], you and your health needs are our priority.  As part of our continuing mission to provide you with exceptional heart  care, we have created designated Provider Care Teams.  These Care Teams include your primary Cardiologist (physician) and Advanced Practice Providers (APPs -  Physician Assistants and Nurse Practitioners) who all work together to provide you with the care you need, when you need it.  Your next appointment:   1 year(s)  Provider:   Mertie Moores, MD     Signed, Mertie Moores, MD  01/18/2023 1:42 PM    [facility]

## 2023-01-18 ENCOUNTER — Encounter: Payer: Self-pay | Admitting: Cardiovascular Disease

## 2023-01-18 ENCOUNTER — Ambulatory Visit: Payer: Medicare PPO | Attending: Cardiovascular Disease | Admitting: Cardiovascular Disease

## 2023-01-18 VITALS — BP 138/88 | HR 73 | Ht 68.0 in | Wt 178.0 lb

## 2023-01-18 DIAGNOSIS — I2699 Other pulmonary embolism without acute cor pulmonale: Secondary | ICD-10-CM | POA: Diagnosis not present

## 2023-01-18 DIAGNOSIS — I10 Essential (primary) hypertension: Secondary | ICD-10-CM | POA: Diagnosis not present

## 2023-01-18 NOTE — Patient Instructions (Signed)
Medication Instructions:  Your physician recommends that you continue on your current medications as directed. Please refer to the Current Medication list given to you today.  *If you need a refill on your cardiac medications before your next appointment, please call your pharmacy*   Lab Work: NONE If you have labs (blood work) drawn today and your tests are completely normal, you will receive your results only by: [place] (if you have MyChart) OR A paper copy in the mail If you have any lab test that is abnormal or we need to change your treatment, we will call you to review the results.   Testing/Procedures: Ambulatory Referral to Hemotology   Follow-Up: At [facility], you and your health needs are our priority.  As part of our continuing mission to provide you with exceptional heart care, we have created designated Provider Care Teams.  These Care Teams include your primary Cardiologist (physician) and Advanced Practice Providers (APPs -  Physician Assistants and Nurse Practitioners) who all work together to provide you with the care you need, when you need it.  Your next appointment:   1 year(s)  Provider:   Mertie Moores, MD

## 2023-01-21 ENCOUNTER — Telehealth: Payer: Self-pay | Admitting: Hematology

## 2023-01-21 NOTE — Telephone Encounter (Signed)
Scheduled appt per 2/12 referral. Pt is aware of appt date and time. Pt is aware to arrive 15 mins prior to appt time and to bring and updated insurance card. Pt is aware of appt location.   

## 2023-01-23 ENCOUNTER — Inpatient Hospital Stay: Payer: Medicare PPO | Attending: Hematology | Admitting: Hematology

## 2023-01-23 ENCOUNTER — Other Ambulatory Visit: Payer: Medicare PPO

## 2023-01-23 ENCOUNTER — Inpatient Hospital Stay: Payer: Medicare PPO

## 2023-01-23 VITALS — BP 130/86 | HR 61 | Temp 97.7°F | Resp 20 | Wt 178.0 lb

## 2023-01-23 DIAGNOSIS — D6869 Other thrombophilia: Secondary | ICD-10-CM

## 2023-01-23 DIAGNOSIS — Z86711 Personal history of pulmonary embolism: Secondary | ICD-10-CM | POA: Diagnosis not present

## 2023-01-23 DIAGNOSIS — I482 Chronic atrial fibrillation, unspecified: Secondary | ICD-10-CM

## 2023-01-23 DIAGNOSIS — I10 Essential (primary) hypertension: Secondary | ICD-10-CM | POA: Insufficient documentation

## 2023-01-23 DIAGNOSIS — Z79899 Other long term (current) drug therapy: Secondary | ICD-10-CM | POA: Diagnosis not present

## 2023-01-23 DIAGNOSIS — E785 Hyperlipidemia, unspecified: Secondary | ICD-10-CM | POA: Diagnosis not present

## 2023-01-23 DIAGNOSIS — Z7901 Long term (current) use of anticoagulants: Secondary | ICD-10-CM | POA: Diagnosis not present

## 2023-01-23 DIAGNOSIS — I2693 Single subsegmental pulmonary embolism without acute cor pulmonale: Secondary | ICD-10-CM

## 2023-01-23 LAB — D-DIMER, QUANTITATIVE: D-Dimer, Quant: <0.27 ug/mL-FEU (ref 0.00–0.50)

## 2023-01-23 LAB — CMP (CANCER CENTER ONLY)
ALT: 20 U/L (ref 0–44)
AST: 16 U/L (ref 15–41)
Albumin: 4.8 g/dL (ref 3.5–5.0)
Alkaline Phosphatase: 51 U/L (ref 38–126)
Anion gap: 5 (ref 5–15)
BUN: 16 mg/dL (ref 8–23)
CO2: 28 mmol/L (ref 22–32)
Calcium: 9.9 mg/dL (ref 8.9–10.3)
Chloride: 108 mmol/L (ref 98–111)
Creatinine: 1.09 mg/dL (ref 0.61–1.24)
GFR, Estimated: >60 mL/min (ref >60)
Glucose, Bld: 88 mg/dL (ref 70–99)
Potassium: 3.7 mmol/L (ref 3.5–5.1)
Sodium: 141 mmol/L (ref 135–145)
Total Bilirubin: 0.5 mg/dL (ref 0.3–1.2)
Total Protein: 7.9 g/dL (ref 6.5–8.1)

## 2023-01-23 LAB — CBC WITH DIFFERENTIAL (CANCER CENTER ONLY)
Abs Immature Granulocytes: 0.01 10*3/uL (ref 0.00–0.07)
Basophils Absolute: 0 10*3/uL (ref 0.0–0.1)
Basophils Relative: 1 %
Eosinophils Absolute: 0.1 10*3/uL (ref 0.0–0.5)
Eosinophils Relative: 2 %
HCT: 42.7 % (ref 39.0–52.0)
Hemoglobin: 14.1 g/dL (ref 13.0–17.0)
Immature Granulocytes: 0 %
Lymphocytes Relative: 32 %
Lymphs Abs: 1.8 10*3/uL (ref 0.7–4.0)
MCH: 28.8 pg (ref 26.0–34.0)
MCHC: 33 g/dL (ref 30.0–36.0)
MCV: 87.1 fL (ref 80.0–100.0)
Monocytes Absolute: 0.3 10*3/uL (ref 0.1–1.0)
Monocytes Relative: 5 %
Neutro Abs: 3.3 10*3/uL (ref 1.7–7.7)
Neutrophils Relative %: 60 %
Platelet Count: 229 10*3/uL (ref 150–400)
RBC: 4.9 MIL/uL (ref 4.22–5.81)
RDW: 13.1 % (ref 11.5–15.5)
WBC Count: 5.4 10*3/uL (ref 4.0–10.5)
nRBC: 0 % (ref 0.0–0.2)

## 2023-01-23 LAB — ANTITHROMBIN III: AntiThromb III Func: 99 % (ref 75–120)

## 2023-01-23 NOTE — Progress Notes (Signed)
[facility]   HEMATOLOGY/ONCOLOGY CONSULTATION NOTE  Date of Service: 01/23/2023  Patient Care Team: Janith Lima, MD as PCP - General (Internal Medicine) Nahser, Wonda Cheng, MD as PCP - Cardiology (Cardiology)  CHIEF COMPLAINTS/PURPOSE OF CONSULTATION:  Recommendations regarding anticoagulation for history of pulmonary embolism  HISTORY OF PRESENTING ILLNESS:  Max Perez is a wonderful 72 y.o. male who has been referred to [place] by Dr Mertie Moores MD for evaluation and recommendations regarding anticoagulation for history of pulmonary embolism.      MEDICAL HISTORY:  Past Medical History:  Diagnosis Date   Chest pain    Elevated blood pressure reading    Elevated BP    Hypercalcemia    Hyperlipidemia    Hyperplastic colonic polyp    Hypertension    PE (pulmonary thromboembolism) ([place]) 10/07/2021    SURGICAL HISTORY: Past Surgical History:  Procedure Laterality Date   COLONOSCOPY  2010   hyperplastic polyp    SOCIAL HISTORY: Social History   Socioeconomic History   Marital status: Single    Spouse name: Not on file   Number of children: Not on file   Years of education: Not on file   Highest education level: Not on file  Occupational History   Not on file  Tobacco Use   Smoking status: Never   Smokeless tobacco: Never  Vaping Use   Vaping Use: Never used  Substance and Sexual Activity   Alcohol use: No   Drug use: No   Sexual activity: Yes    Partners: Female  Other Topics Concern   Not on file  Social History Narrative   Not on file   Social Determinants of Health   Financial Resource Strain: Not on file  Food Insecurity: Not on file  Transportation Needs: Not on file  Physical Activity: Not on file  Stress: Not on file  Social Connections: Not on file  Intimate Partner Violence: Not on file    FAMILY HISTORY: Family History  Problem Relation Age of Onset   Hypertension Mother    Alcohol abuse Father    Stroke Father 40   Alcohol abuse  Sister    Hypertension Sister    Lung cancer Sister        smoker   Heart disease Sister        hole in heart   Hypertension Son    Diabetes Son    Depression Son    Asthma Son    Heart attack Neg Hx     ALLERGIES:  has No Known Allergies.  MEDICATIONS:  Current Outpatient Medications  Medication Sig Dispense Refill   apixaban (ELIQUIS) 5 MG TABS tablet Take 1 tablet (5 mg total) by mouth 2 (two) times daily. 60 tablet 5   cholecalciferol (VITAMIN D3) 25 MCG (1000 UNIT) tablet Take 1,000 Units by mouth daily. Take 1 tablet by mouth once daily     losartan (COZAAR) 100 MG tablet Take 1 tablet (100 mg total) by mouth daily. 90 tablet 0   rosuvastatin (CRESTOR) 10 MG tablet Take 1 tablet (10 mg total) by mouth daily. 90 tablet 0   sildenafil (VIAGRA) 100 MG tablet Take 1 tablet (100 mg total) by mouth daily as needed for erectile dysfunction. 10 tablet 0   No current facility-administered medications for this visit.    REVIEW OF SYSTEMS:    10 Point review of Systems was done is negative except as noted above.  PHYSICAL EXAMINATION: ECOG PERFORMANCE STATUS: {CHL ONC ECOG [id number]  .  Vitals:   01/23/23 1105  BP: (!) 152/97  Pulse: 61  Resp: 20  Temp: 97.7 F (36.5 C)  SpO2: 100%   Filed Weights   01/23/23 1105  Weight: 178 lb (80.7 kg)   .Body mass index is 27.06 kg/m.  GENERAL:alert, in no acute distress and comfortable SKIN: no acute rashes, no significant lesions EYES: conjunctiva are pink and non-injected, sclera anicteric OROPHARYNX: MMM, no exudates, no oropharyngeal erythema or ulceration NECK: supple, no JVD LYMPH:  no palpable lymphadenopathy in the cervical, axillary or inguinal regions LUNGS: clear to auscultation b/l with normal respiratory effort HEART: regular rate & rhythm ABDOMEN:  normoactive bowel sounds , non tender, not distended. Extremity: no pedal edema PSYCH: alert & oriented x 3 with fluent speech NEURO: no focal motor/sensory  deficits  LABORATORY DATA:  I have reviewed the data as listed  .    Latest Ref Rng & Units 09/24/2022   10:08 AM 10/14/2021    5:45 PM 07/08/2021    7:48 AM  CBC  WBC 4.0 - 10.5 K/uL 5.5  7.3  5.4   Hemoglobin 13.0 - 17.0 g/dL 14.3  14.5  14.6   Hematocrit 39.0 - 52.0 % 42.3  43.2  44.0   Platelets 150.0 - 400.0 K/uL 233.0  255  266     .    Latest Ref Rng & Units 09/24/2022   10:08 AM 02/18/2022    9:08 AM 10/14/2021    5:45 PM  CMP  Glucose 70 - 99 mg/dL 86   110   BUN 6 - 23 mg/dL 17   17   Creatinine 0.40 - 1.50 mg/dL 1.10   1.27   Sodium 135 - 145 mEq/L 141   139   Potassium 3.5 - 5.1 mEq/L 4.1   4.0   Chloride 96 - 112 mEq/L 105   104   CO2 19 - 32 mEq/L 29   26   Calcium 8.4 - 10.5 mg/dL 10.1   9.7   Total Protein 6.0 - 8.3 g/dL 7.6   7.4   Total Bilirubin 0.2 - 1.2 mg/dL 0.9   0.9   Alkaline Phos 39 - 117 U/L 56   49   AST 0 - 37 U/L 18   22   ALT 0 - 53 U/L 19  22  24      $ RADIOGRAPHIC STUDIES: I have personally reviewed the radiological images as listed and agreed with the findings in the report. No results found.  ASSESSMENT & PLAN:  ***  All of the patients questions were answered with apparent satisfaction. The patient knows to call the clinic with any problems, questions or concerns.  I spent {CHL ONC TIME VISIT - [id number] counseling the patient face to face. The total time spent in the appointment was {CHL ONC TIME VISIT - [id number] and more than 50% was on counseling and direct patient cares.    Sullivan Lone MD [place] AAHIVMS [facility] Hematology/Oncology Physician [facility]  (Office):       [phone] (Work cell):  [phone] (Fax):           [phone]  01/23/2023 11:16 AM

## 2023-01-25 ENCOUNTER — Telehealth: Payer: Self-pay | Admitting: Hematology

## 2023-01-25 LAB — LUPUS ANTICOAGULANT PANEL
DRVVT: 50.2 s — ABNORMAL HIGH (ref 0.0–47.0)
PTT Lupus Anticoagulant: 35.6 s (ref 0.0–43.5)

## 2023-01-25 LAB — CARDIOLIPIN ANTIBODIES, IGG, IGM, IGA
Anticardiolipin IgA: <9 APL U/mL (ref 0–11)
Anticardiolipin IgG: <9 GPL U/mL (ref 0–14)
Anticardiolipin IgM: <9 MPL U/mL (ref 0–12)

## 2023-01-25 LAB — PROTEIN C ACTIVITY: Protein C Activity: 106 % (ref 73–180)

## 2023-01-25 LAB — DRVVT CONFIRM: dRVVT Confirm: 0.9 ratio (ref 0.8–1.2)

## 2023-01-25 LAB — BETA-2-GLYCOPROTEIN I ABS, IGG/M/A
Beta-2 Glyco I IgG: <9 GPI IgG units (ref 0–20)
Beta-2-Glycoprotein I IgA: <9 GPI IgA units (ref 0–25)
Beta-2-Glycoprotein I IgM: <9 GPI IgM units (ref 0–32)

## 2023-01-25 LAB — PROTEIN S ACTIVITY: Protein S Activity: 94 % (ref 63–140)

## 2023-01-25 LAB — PROTEIN C, TOTAL: Protein C, Total: 116 % (ref 60–150)

## 2023-01-25 LAB — PROTEIN S, TOTAL: Protein S Ag, Total: 65 % (ref 60–150)

## 2023-01-25 LAB — DRVVT MIX: dRVVT Mix: 43.1 s — ABNORMAL HIGH (ref 0.0–40.4)

## 2023-01-25 NOTE — Telephone Encounter (Signed)
Called patient per 2/17 los notes to schedule f/u. Patient scheduled and notified.

## 2023-01-26 LAB — HOMOCYSTEINE: Homocysteine: 10.8 umol/L (ref 0.0–19.2)

## 2023-01-27 ENCOUNTER — Ambulatory Visit (HOSPITAL_COMMUNITY)
Admission: RE | Admit: 2023-01-27 | Discharge: 2023-01-27 | Disposition: A | Discharge status: Home / Self Care | Payer: Medicare PPO | Source: Ambulatory Visit | Attending: Hematology | Admitting: Hematology

## 2023-01-27 DIAGNOSIS — I2693 Single subsegmental pulmonary embolism without acute cor pulmonale: Secondary | ICD-10-CM | POA: Diagnosis not present

## 2023-01-27 NOTE — Progress Notes (Signed)
Lower extremity venous duplex has been completed.   Preliminary results in CV Proc.   Jinny Blossom Marnita Poirier 01/27/2023 9:23 AM

## 2023-02-03 LAB — FACTOR 5 LEIDEN

## 2023-02-04 LAB — PROTHROMBIN GENE MUTATION

## 2023-02-08 ENCOUNTER — Telehealth: Payer: Self-pay

## 2023-02-08 NOTE — Telephone Encounter (Signed)
   Pre-operative Risk Assessment    Patient Name: Max Perez  DOB: 10/15/1951 MRN: [id number]      Request for Surgical Clearance    Procedure:   COLONOSCOPY  Date of Surgery:  Clearance 03/09/23                                 Surgeon:  DR. BABA Surgeon's Group or Practice Name:  [facility]  Phone number:  [phone] Fax number:  [phone]   Type of Clearance Requested:   - Pharmacy:  Hold Apixaban (Eliquis) 2 DAYS PRIOR TO PROCEDURE   Type of Anesthesia:  Not Indicated   Additional requests/questions:    SignedJacinta Shoe   02/08/2023, 2:58 PM

## 2023-02-09 NOTE — Telephone Encounter (Signed)
Patient with diagnosis of PE 10/2021 on Eliquis for anticoagulation. Dr Acie Fredrickson referred pt to heme onc for ongoing anticoag management of PE. Looks like there is discussion of pt potentially stopping Eliquis. Would forward to Dr Irene Limbo who recently saw pt to provide input on periprocedural anticoag hold.

## 2023-02-09 NOTE — Telephone Encounter (Signed)
     Primary Cardiologist: Mertie Moores, MD  Chart reviewed as part of pre-operative protocol coverage. Given past medical history and time since last visit, based on ACC/AHA guidelines, Aleph [place]k would be at acceptable risk for the planned procedure without further cardiovascular testing.   Patient with diagnosis of PE 10/2021 on Eliquis for anticoagulation. Dr Acie Fredrickson referred pt to heme onc for ongoing anticoag management of PE. Looks like there is discussion of pt potentially stopping Eliquis. Would forward to Dr Irene Limbo who recently saw pt to provide input on periprocedural anticoag hold.   I will route this recommendation to the requesting party via Epic fax function and remove from pre-op pool.  Please call with questions.  Jossie Ng. Laydon Martis NP-C     02/09/2023, 10:16 AM [place] [address] Office [phone] Fax [phone]

## 2023-02-10 ENCOUNTER — Inpatient Hospital Stay: Payer: Medicare PPO | Attending: Hematology | Admitting: Hematology

## 2023-02-10 DIAGNOSIS — Z86711 Personal history of pulmonary embolism: Secondary | ICD-10-CM | POA: Diagnosis present

## 2023-02-10 DIAGNOSIS — I2510 Atherosclerotic heart disease of native coronary artery without angina pectoris: Secondary | ICD-10-CM | POA: Insufficient documentation

## 2023-02-10 DIAGNOSIS — I10 Essential (primary) hypertension: Secondary | ICD-10-CM | POA: Insufficient documentation

## 2023-02-10 DIAGNOSIS — Z7901 Long term (current) use of anticoagulants: Secondary | ICD-10-CM | POA: Insufficient documentation

## 2023-02-10 DIAGNOSIS — Z8719 Personal history of other diseases of the digestive system: Secondary | ICD-10-CM | POA: Diagnosis not present

## 2023-02-10 DIAGNOSIS — Z8616 Personal history of COVID-19: Secondary | ICD-10-CM | POA: Insufficient documentation

## 2023-02-10 DIAGNOSIS — I2693 Single subsegmental pulmonary embolism without acute cor pulmonale: Secondary | ICD-10-CM | POA: Insufficient documentation

## 2023-02-10 DIAGNOSIS — E785 Hyperlipidemia, unspecified: Secondary | ICD-10-CM | POA: Insufficient documentation

## 2023-02-10 MED ORDER — ASPIRIN 81 MG PO TBEC: 81.0000 mg | DELAYED_RELEASE_TABLET | Freq: Every day | ORAL | 12 refills | Status: AC

## 2023-02-10 NOTE — Progress Notes (Signed)
Marland Kitchen   HEMATOLOGY/ONCOLOGY PHONE VISIT NOTE  Date of Service: 02/10/2023  Patient Care Team: Janith Lima, MD as PCP - General (Internal Medicine) Nahser, Wonda Cheng, MD as PCP - Cardiology (Cardiology) Brunetta Genera, MD as Consulting Physician (Hematology)  CHIEF COMPLAINTS/PURPOSE OF CONSULTATION:  Recommendations regarding anticoagulation for history of pulmonary embolism  HISTORY OF PRESENTING ILLNESS:   Max Perez is a wonderful 72 y.o. male who has been referred to [place] by Dr Mertie Moores MD for evaluation and recommendations regarding anticoagulation for history of pulmonary embolism.  Patient has a history of hypertension, dyslipidemia, mild coronary artery disease with a history of chest pain. As a part of his chest pain workup he had a CT coronary angiogram performed in November 2022 which revealed Left main-normal LAD has mild to moderate plaque in the proximal vessel Left circumflex artery is small and nondominant. Right coronary artery is large and dominant.  There is mild irregularities in the proximal and mid RCA. As a part of the CT over read he was noted to have a subsegmental sized pulmonary embolus to the right lower lobe.  Patient reports he did not have any pleuritic chest pain or any overt shortness of breath and so this finding of PE was noted to be incidental. He reports having had significant COVID-19 infection 2 to 3 weeks prior to this finding having respiratory symptoms on this for a while. 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  No hypercoagulability workup or other testing was done.  Patient did not have ultrasounds of his lower extremities or any other additional workup for VTE at that time.  He was hesitant to get physically active at the time after his finding of PE intermittent chest pains for which she continues to follow with cardiology.  He  reports that he took his Eliquis for several months but then himself switched to baby aspirin.  He was recently seen by Dr. Acie Fredrickson and was switched back to Eliquis. He was referred to [place] to determine need for ongoing therapeutic anticoagulation since he is keen to get back off the anticoagulation and go to baby aspirin.  INTERVAL HISTORY:   Max Perez is a wonderful 72 y/o male who is here for recommendations regarding anticoagulation for history of pulmonary embolism. Patient was last seen by me on 01/23/2023 and was doing well overall.  I connected with Max Perez on 02/10/23 at  8:40 AM EST by telephone visit and verified that I am speaking with the correct person using two identifiers.   I discussed the limitations, risks, security and privacy concerns of performing an evaluation and management service by telemedicine and the availability of in-person appointments. I also discussed with the patient that there may be a patient responsible charge related to this service. The patient expressed understanding and agreed to proceed.   Other persons participating in the visit and their role in the encounter: none   Patient's location: home  Provider's location: [facility]   Chief Complaint: Recommendations regarding anticoagulation for history of pulmonary embolism   Today, he reports that he is doing well overall and denies any no new concerns. He confirms that he previously took Aspirin which did not trigger any clot formation. The recents of his recent lab work was discussed at great length today.  MEDICAL HISTORY:  Past Medical History:  Diagnosis Date   Chest pain    Elevated blood pressure  reading    Elevated BP    Hypercalcemia    Hyperlipidemia    Hyperplastic colonic polyp    Hypertension    PE (pulmonary thromboembolism) ([place]) 10/07/2021    SURGICAL HISTORY: Past Surgical History:  Procedure Laterality Date   COLONOSCOPY  2010   hyperplastic polyp    SOCIAL  HISTORY: Social History   Socioeconomic History   Marital status: Married    Spouse name: Not on file   Number of children: Not on file   Years of education: Not on file   Highest education level: Not on file  Occupational History   Not on file  Tobacco Use   Smoking status: Never   Smokeless tobacco: Never  Vaping Use   Vaping Use: Never used  Substance and Sexual Activity   Alcohol use: No   Drug use: No   Sexual activity: Yes    Partners: Female  Other Topics Concern   Not on file  Social History Narrative   Not on file   Social Determinants of Health   Financial Resource Strain: Not on file  Food Insecurity: No Food Insecurity (01/23/2023)   Hunger Vital Sign    Worried About Running Out of Food in the Last Year: Never true    Ran Out of Food in the Last Year: Never true  Transportation Needs: No Transportation Needs (01/23/2023)   PRAPARE - Hydrologist (Medical): No    Lack of Transportation (Non-Medical): No  Physical Activity: Not on file  Stress: Not on file  Social Connections: Not on file  Intimate Partner Violence: Not At Risk (01/23/2023)   Humiliation, Afraid, Rape, and Kick questionnaire    Fear of Current or Ex-Partner: No    Emotionally Abused: No    Physically Abused: No    Sexually Abused: No    FAMILY HISTORY: Family History  Problem Relation Age of Onset   Hypertension Mother    Alcohol abuse Father    Stroke Father 71   Alcohol abuse Sister    Hypertension Sister    Lung cancer Sister        smoker   Heart disease Sister        hole in heart   Hypertension Son    Diabetes Son    Depression Son    Asthma Son    Heart attack Neg Hx     ALLERGIES:  has No Known Allergies.  MEDICATIONS:  Current Outpatient Medications  Medication Sig Dispense Refill   apixaban (ELIQUIS) 5 MG TABS tablet Take 1 tablet (5 mg total) by mouth 2 (two) times daily. 60 tablet 5   cholecalciferol (VITAMIN D3) 25 MCG (1000  UNIT) tablet Take 1,000 Units by mouth daily. Take 1 tablet by mouth once daily     losartan (COZAAR) 100 MG tablet Take 1 tablet (100 mg total) by mouth daily. 90 tablet 0   rosuvastatin (CRESTOR) 10 MG tablet Take 1 tablet (10 mg total) by mouth daily. 90 tablet 0   sildenafil (VIAGRA) 100 MG tablet Take 1 tablet (100 mg total) by mouth daily as needed for erectile dysfunction. 10 tablet 0   No current facility-administered medications for this visit.    REVIEW OF SYSTEMS:    10 Point review of Systems was done is negative except as noted above.   PHYSICAL EXAMINATION: TELEPHONE VISIT   LABORATORY DATA:  I have reviewed the data as listed  .    Latest Ref Rng &  Units 01/23/2023   12:47 PM 09/24/2022   10:08 AM 10/14/2021    5:45 PM  CBC  WBC 4.0 - 10.5 K/uL 5.4  5.5  7.3   Hemoglobin 13.0 - 17.0 g/dL 14.1  14.3  14.5   Hematocrit 39.0 - 52.0 % 42.7  42.3  43.2   Platelets 150 - 400 K/uL 229  233.0  255     .    Latest Ref Rng & Units 01/23/2023   12:47 PM 09/24/2022   10:08 AM 02/18/2022    9:08 AM  CMP  Glucose 70 - 99 mg/dL 88  86    BUN 8 - 23 mg/dL 16  17    Creatinine 0.61 - 1.24 mg/dL 1.09  1.10    Sodium 135 - 145 mmol/L 141  141    Potassium 3.5 - 5.1 mmol/L 3.7  4.1    Chloride 98 - 111 mmol/L 108  105    CO2 22 - 32 mmol/L 28  29    Calcium 8.9 - 10.3 mg/dL 9.9  10.1    Total Protein 6.5 - 8.1 g/dL 7.9  7.6    Total Bilirubin 0.3 - 1.2 mg/dL 0.5  0.9    Alkaline Phos 38 - 126 U/L 51  56    AST 15 - 41 U/L 16  18    ALT 0 - 44 U/L 20  19  22     D-Dimer, Quantitative 01/23/2023:   RADIOGRAPHIC STUDIES: I have personally reviewed the radiological images as listed and agreed with the findings in the report. VAS [place] LOWER EXTREMITY VENOUS (DVT)  Result Date: 01/27/2023  Lower Venous DVT Study Patient Name:  Max Perez  Date of Exam:   01/27/2023 Medical Rec #: [id number]         Accession #:    [id number] Date of Birth: 16-Sep-1951        Patient Gender:  M Patient Age:   68 years Exam Location:  [facility] Procedure:      VAS [place] LOWER EXTREMITY VENOUS (DVT) Referring Phys: Sullivan Lone --------------------------------------------------------------------------------  Indications: Pulmonary embolism.  Comparison Study: no prior Performing Technologist: Archie Patten RVS  Examination Guidelines: A complete evaluation includes B-mode imaging, spectral Doppler, color Doppler, and power Doppler as needed of all accessible portions of each vessel. Bilateral testing is considered an integral part of a complete examination. Limited examinations for reoccurring indications may be performed as noted. The reflux portion of the exam is performed with the patient in reverse Trendelenburg.  +---------+---------------+---------+-----------+----------+--------------+ RIGHT    CompressibilityPhasicitySpontaneityPropertiesThrombus Aging +---------+---------------+---------+-----------+----------+--------------+ CFV      Full           Yes      Yes                                 +---------+---------------+---------+-----------+----------+--------------+ SFJ      Full                                                        +---------+---------------+---------+-----------+----------+--------------+ FV Prox  Full                                                        +---------+---------------+---------+-----------+----------+--------------+  FV Mid   Full                                                        +---------+---------------+---------+-----------+----------+--------------+ FV DistalFull                                                        +---------+---------------+---------+-----------+----------+--------------+ PFV      Full                                                        +---------+---------------+---------+-----------+----------+--------------+ POP      Full           Yes      Yes                                  +---------+---------------+---------+-----------+----------+--------------+ PTV      Full                                                        +---------+---------------+---------+-----------+----------+--------------+ PERO     Full                                                        +---------+---------------+---------+-----------+----------+--------------+   +---------+---------------+---------+-----------+----------+--------------+ LEFT     CompressibilityPhasicitySpontaneityPropertiesThrombus Aging +---------+---------------+---------+-----------+----------+--------------+ CFV      Full           Yes      Yes                                 +---------+---------------+---------+-----------+----------+--------------+ SFJ      Full                                                        +---------+---------------+---------+-----------+----------+--------------+ FV Prox  Full                                                        +---------+---------------+---------+-----------+----------+--------------+ FV Mid   Full                                                        +---------+---------------+---------+-----------+----------+--------------+  FV DistalFull                                                        +---------+---------------+---------+-----------+----------+--------------+ PFV      Full                                                        +---------+---------------+---------+-----------+----------+--------------+ POP      Full           Yes      Yes                                 +---------+---------------+---------+-----------+----------+--------------+ PTV      Full                                                        +---------+---------------+---------+-----------+----------+--------------+ PERO     Full                                                         +---------+---------------+---------+-----------+----------+--------------+     Summary: BILATERAL: - No evidence of deep vein thrombosis seen in the lower extremities, bilaterally. -No evidence of popliteal cyst, bilaterally.   *See table(s) above for measurements and observations. Electronically signed by Jamelle Haring on 01/27/2023 at 10:00:41 PM.    Final    CT CORONARY MORPH W/CTA COR W/SCORE W/CA W/CM &/OR WO/CM (Accession [id number]) (Order [id number]) Imaging Date: 10/14/2021 Department: [facility] CT IMAGING Released By: Michael Boston Authorizing: Nahser, Wonda Cheng, MD   Exam Status  Status  Final [99]   PACS Intelerad Image Link   Show images for CT CORONARY MORPH W/CTA COR W/SCORE W/CA W/CM &/OR WO/CM Addendum  ADDENDUM REPORT: 10/14/2021 13:04   CLINICAL DATA:  This is a 72 year old male with chest pain. Medical history includes hypertension and hyperlipidemia.   EXAM: Cardiac/Coronary  CTA   TECHNIQUE: The patient was scanned on a [employer].   FINDINGS: A 100 kV prospective scan was triggered in the descending thoracic aorta at 111 HU's. Axial non-contrast 3 mm slices were carried out through the heart. The data set was analyzed on a dedicated work station and scored using the [employer]. Gantry rotation speed was 250 msecs and collimation was .6 mm. No beta blockade and 0.8 mg of sl NTG was given. The 3D data set was reconstructed in 5% intervals of the 67-82 % of the R-R cycle. Diastolic phases were analyzed on a dedicated work station using MPR, MIP and VRT modes. The patient received 80 cc of contrast.   Aorta: Normal size (35 mm).  No calcifications.  No dissection.   Aortic Valve:  Trileaflet.  No calcifications.   Coronary Arteries:  Normal coronary origin.  Right dominance.   RCA is a large dominant artery that gives rise to PDA and PLA. There is minimal (<24%) diffuse calcified plaques in the proximal to mid RCA. In  the distal RCA right above the bifurcation there is a minimal calcified plaque.   Left main is a large artery that gives rise to LAD and LCX arteries. There is mild calcified plaque in the distal left main.   LAD is a large type 3 vessel. The proximal LAD with mild (25-49%) calcified plaques. There is mild calcified plaque in the mid LAD. The mid to distal LAD with diffuse minimal calcified plaques. D1 and D2 as small caliber vessels with no plaque. D3 is a small caliber vessel with minimal calcification mid vessel.   LCX is a non-dominant artery that gives rise to one large OM1 branch. There is no plaque in the LCX. The OM1 vessel with mild calcified plaque in the proximal portion. The mid and distal portion with no plaques.   Coronary Calcium Score:   Left main: 0   Left anterior descending artery: 467   Left circumflex artery: 116   Right coronary artery: 317   Total: 901   Percentile: 94   Other findings:   Normal pulmonary vein drainage into the left atrium.   Normal left atrial appendage without a thrombus.   Normal size of the pulmonary artery.   IMPRESSION: 1. Coronary calcium score of 901. This was 63 percentile for age and sex matched control.   2. Normal coronary origin with right dominance.   3. No evidence of CAD. CAD-RADS 2. Mild non-obstructive CAD (25-49%). Consider non-atherosclerotic causes of chest pain. Consider preventive therapy and risk factor modification.     Electronically Signed   By: Berniece Salines D.O.   On: 10/14/2021 13:04    Addended by Berniece Salines, DO on 10/14/2021  1:06 PM    Study Result  Narrative & Impression  EXAM: OVER-READ INTERPRETATION  CT CHEST   The following report is an over-read performed by radiologist Dr. Vinnie Langton of [facility], [place] on 10/14/2021. This over-read does not include interpretation of cardiac or coronary anatomy or pathology. The coronary calcium score/coronary  CTA interpretation by the cardiologist is attached.   COMPARISON:  Chest CTA 07/08/2021.   FINDINGS: Series 9 images 98-108 demonstrate a filling defect within a subsegmental sized pulmonary arterial branch to the right lower lobe, compatible with pulmonary embolism. Tiny calcified granuloma in the right lower lobe. Within the visualized portions of the thorax there are no suspicious appearing pulmonary nodules or masses, there is no acute consolidative airspace disease, no pleural effusions, no pneumothorax and no lymphadenopathy. Visualized portions of the upper abdomen are unremarkable. There are no aggressive appearing lytic or blastic lesions noted in the visualized portions of the skeleton.   IMPRESSION: 1. Study is positive for subsegmental sized pulmonary embolus to the right lower lobe.   These results will be called to the ordering clinician or representative by the Radiologist Assistant, and communication documented in the PACS or [employer].   Electronically Signed: By: Vinnie Langton M.D.    ASSESSMENT & PLAN:   72 year old male with history of hypertension, dyslipidemia, mild coronary artery disease with  #1 Incidentally noted right lower lobe subsegmental PE in November 2022 This was noted incidentally on CT coronary angiogram.  Patient did not have any clear symptoms attributable to this. Potentially provoked by recent COVID-19 infection 2 to 3 weeks prior to this No Definitive family  history of hypercoagulable state or VTE. No personal history of VTE or arterial thrombosis prior to that finding. -No previous venous Doppler ultrasounds of his lower extremities. No previous coagulable testing done. Previously recommended long-term Eliquis by cardiologist but patient wants to try to get off this.  PLAN:  -Discussed lab results on 01/23/2023 in detail with patient. CBC normal,  showed WBC of 5.4K, hemoglobin of 14.1, and platelets of 229K. -no  polycythemia or thrombocytosis CMP normal -patient's baseline D-dimer, ultrasound lower extremities and hypercoagulability testing does not reveal any overt concerns -D-dimer test negative, no signs of blood clot formation at this time -[place] of lower extremities showed no evidence of LE DVTs or signs of blood clots -No evidence or signs of clotting disorders at this time.  Protein c levels normal Protein s levels normal Lupus anticoagulant undetected Prothrombin gene mutation negative -patient was previously on Aspirin which did not trigger any clots -reasonable to switch Eliquis to baby Aspirin 81 MG daily at this time for continued prevention of VTE -discussed option of repeat D-dimer 6-8 weeks after on Aspirin for any signs of clot formation, patient is agreeable. If normal, will continue Aspirin.  . No orders of the defined types were placed in this encounter.   FOLLOW-UP: Phone visit Dr Irene Limbo in 6 weeks Labs 1 day prior to phone visit  The total time spent in the appointment was 20 minutes* .  All of the patient's questions were answered with apparent satisfaction. The patient knows to call the clinic with any problems, questions or concerns.   Sullivan Lone MD MS AAHIVMS [facility] Hematology/Oncology Physician [facility]  .*Total Encounter Time as defined by the Centers for Medicare and Medicaid Services includes, in addition to the face-to-face time of a patient visit (documented in the note above) non-face-to-face time: obtaining and reviewing outside history, ordering and reviewing medications, tests or procedures, care coordination (communications with other health care professionals or caregivers) and documentation in the medical record.   I,Mitra Faeizi,acting as a Education administrator for Sullivan Lone, MD.,have documented all relevant documentation on the behalf of Sullivan Lone, MD,as directed by  Sullivan Lone, MD while in the presence of Sullivan Lone, MD.  .I have reviewed the  above documentation for accuracy and completeness, and I agree with the above. Brunetta Genera MD

## 2023-02-11 ENCOUNTER — Telehealth: Payer: Self-pay | Admitting: Hematology

## 2023-02-11 NOTE — Telephone Encounter (Signed)
Called patient per 3/6 los notes to schedule f/u. Left voicemail with new appointment information and contact details if needing to reschedule.

## 2023-03-25 ENCOUNTER — Other Ambulatory Visit: Payer: Medicare PPO

## 2023-03-26 ENCOUNTER — Telehealth: Payer: Medicare PPO | Admitting: Hematology

## 2023-04-02 ENCOUNTER — Other Ambulatory Visit: Payer: Self-pay

## 2023-04-02 ENCOUNTER — Inpatient Hospital Stay: Payer: Medicare (Managed Care) | Attending: Hematology

## 2023-04-02 DIAGNOSIS — I2693 Single subsegmental pulmonary embolism without acute cor pulmonale: Secondary | ICD-10-CM

## 2023-04-02 DIAGNOSIS — Z7982 Long term (current) use of aspirin: Secondary | ICD-10-CM | POA: Insufficient documentation

## 2023-04-02 DIAGNOSIS — Z86711 Personal history of pulmonary embolism: Secondary | ICD-10-CM | POA: Diagnosis present

## 2023-04-02 LAB — CBC WITH DIFFERENTIAL (CANCER CENTER ONLY)
Abs Immature Granulocytes: 0.01 10*3/uL (ref 0.00–0.07)
Basophils Absolute: 0 10*3/uL (ref 0.0–0.1)
Basophils Relative: 1 %
Eosinophils Absolute: 0.2 10*3/uL (ref 0.0–0.5)
Eosinophils Relative: 2 %
HCT: 43.1 % (ref 39.0–52.0)
Hemoglobin: 14.5 g/dL (ref 13.0–17.0)
Immature Granulocytes: 0 %
Lymphocytes Relative: 33 %
Lymphs Abs: 2.2 10*3/uL (ref 0.7–4.0)
MCH: 29.1 pg (ref 26.0–34.0)
MCHC: 33.6 g/dL (ref 30.0–36.0)
MCV: 86.5 fL (ref 80.0–100.0)
Monocytes Absolute: 0.4 10*3/uL (ref 0.1–1.0)
Monocytes Relative: 7 %
Neutro Abs: 3.8 10*3/uL (ref 1.7–7.7)
Neutrophils Relative %: 57 %
Platelet Count: 233 10*3/uL (ref 150–400)
RBC: 4.98 MIL/uL (ref 4.22–5.81)
RDW: 13 % (ref 11.5–15.5)
WBC Count: 6.6 10*3/uL (ref 4.0–10.5)
nRBC: 0 % (ref 0.0–0.2)

## 2023-04-02 LAB — D-DIMER, QUANTITATIVE: D-Dimer, Quant: <0.27 ug/mL-FEU (ref 0.00–0.50)

## 2023-04-05 ENCOUNTER — Inpatient Hospital Stay (HOSPITAL_BASED_OUTPATIENT_CLINIC_OR_DEPARTMENT_OTHER): Payer: Medicare (Managed Care) | Admitting: Hematology

## 2023-04-05 DIAGNOSIS — I2693 Single subsegmental pulmonary embolism without acute cor pulmonale: Secondary | ICD-10-CM

## 2023-04-05 DIAGNOSIS — D6869 Other thrombophilia: Secondary | ICD-10-CM

## 2023-04-05 DIAGNOSIS — I482 Chronic atrial fibrillation, unspecified: Secondary | ICD-10-CM

## 2023-04-05 NOTE — Progress Notes (Signed)
Marland Kitchen   HEMATOLOGY/ONCOLOGY PHONE VISIT NOTE  Date of Service: 04/05/2023  Patient Care Team: Etta Grandchild, MD as PCP - General (Internal Medicine) Nahser, Deloris Ping, MD as PCP - Cardiology (Cardiology) Johney Maine, MD as Consulting Physician (Hematology)  CHIEF COMPLAINTS/PURPOSE OF CONSULTATION:  Recommendations regarding anticoagulation for history of pulmonary embolism  HISTORY OF PRESENTING ILLNESS:   Max Perez is a wonderful 72 y.o. male who has been referred to [place] by Dr Kristeen Miss MD for evaluation and recommendations regarding anticoagulation for history of pulmonary embolism.  Patient has a history of hypertension, dyslipidemia, mild coronary artery disease with a history of chest pain. As a part of his chest pain workup he had a CT coronary angiogram performed in November 2022 which revealed Left main-normal LAD has mild to moderate plaque in the proximal vessel Left circumflex artery is small and nondominant. Right coronary artery is large and dominant.  There is mild irregularities in the proximal and mid RCA. As a part of the CT over read he was noted to have a subsegmental sized pulmonary embolus to the right lower lobe.  Patient reports he did not have any pleuritic chest pain or any overt shortness of breath and so this finding of PE was noted to be incidental. He reports having had significant COVID-19 infection 2 to 3 weeks prior to this finding having respiratory symptoms on this for a while. 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  No hypercoagulability workup or other testing was done.  Patient did not have ultrasounds of his lower extremities or any other additional workup for VTE at that time.  He was hesitant to get physically active at the time after his finding of PE intermittent chest pains for which she continues to follow with cardiology.  He  reports that he took his Eliquis for several months but then himself switched to baby aspirin.  He was recently seen by Dr. Elease Hashimoto and was switched back to Eliquis. He was referred to [place] to determine need for ongoing therapeutic anticoagulation since he is keen to get back off the anticoagulation and go to baby aspirin.  INTERVAL HISTORY:   Max Perez is a wonderful 72 y/o male who is here for recommendations regarding anticoagulation for history of pulmonary embolism.   I connected with Max Perez on 04/05/23 at  8:40 AM EDT by telephone visit and verified that I am speaking with the correct person using two identifiers.  Patient was last seen by me on 02/10/2023 and he was doing well overall.   Patient notes he has been doing well overall without any new medical concerns since our last visit. He denies any new infection issues, abnormal bleeding, fever, chills, night sweats, chest pain, abnormal bowel movement, abdominal pain, or leg swelling.  We discussed the recent lab results, which were stable. Lab results also showed normal D-dimer levels with aspirin.   I discussed the limitations, risks, security and privacy concerns of performing an evaluation and management service by telemedicine and the availability of in-person appointments. I also discussed with the patient that there may be a patient responsible charge related to this service. The patient expressed understanding and agreed to proceed.   Other persons participating in the visit and their role in the encounter: none   Patient's location: home  Provider's location: [facility]   Chief Complaint: Recommendations regarding anticoagulation for history of pulmonary embolism  MEDICAL HISTORY:  Past Medical History:  Diagnosis Date   Chest pain    Elevated blood pressure reading    Elevated BP    Hypercalcemia    Hyperlipidemia    Hyperplastic colonic polyp    Hypertension    PE (pulmonary thromboembolism) (HCC)  10/07/2021    SURGICAL HISTORY: Past Surgical History:  Procedure Laterality Date   COLONOSCOPY  2010   hyperplastic polyp    SOCIAL HISTORY: Social History   Socioeconomic History   Marital status: Married    Spouse name: Not on file   Number of children: Not on file   Years of education: Not on file   Highest education level: Not on file  Occupational History   Not on file  Tobacco Use   Smoking status: Never   Smokeless tobacco: Never  Vaping Use   Vaping Use: Never used  Substance and Sexual Activity   Alcohol use: No   Drug use: No   Sexual activity: Yes    Partners: Female  Other Topics Concern   Not on file  Social History Narrative   Not on file   Social Determinants of Health   Financial Resource Strain: Not on file  Food Insecurity: No Food Insecurity (01/23/2023)   Hunger Vital Sign    Worried About Running Out of Food in the Last Year: Never true    Ran Out of Food in the Last Year: Never true  Transportation Needs: No Transportation Needs (01/23/2023)   PRAPARE - Administrator, Civil Service (Medical): No    Lack of Transportation (Non-Medical): No  Physical Activity: Not on file  Stress: Not on file  Social Connections: Not on file  Intimate Partner Violence: Not At Risk (01/23/2023)   Humiliation, Afraid, Rape, and Kick questionnaire    Fear of Current or Ex-Partner: No    Emotionally Abused: No    Physically Abused: No    Sexually Abused: No    FAMILY HISTORY: Family History  Problem Relation Age of Onset   Hypertension Mother    Alcohol abuse Father    Stroke Father 56   Alcohol abuse Sister    Hypertension Sister    Lung cancer Sister        smoker   Heart disease Sister        hole in heart   Hypertension Son    Diabetes Son    Depression Son    Asthma Son    Heart attack Neg Hx     ALLERGIES:  has No Known Allergies.  MEDICATIONS:  Current Outpatient Medications  Medication Sig Dispense Refill   aspirin EC  81 MG tablet Take 1 tablet (81 mg total) by mouth daily. Swallow whole. 30 tablet 12   cholecalciferol (VITAMIN D3) 25 MCG (1000 UNIT) tablet Take 1,000 Units by mouth daily. Take 1 tablet by mouth once daily     losartan (COZAAR) 100 MG tablet Take 1 tablet (100 mg total) by mouth daily. 90 tablet 0   rosuvastatin (CRESTOR) 10 MG tablet Take 1 tablet (10 mg total) by mouth daily. 90 tablet 0   sildenafil (VIAGRA) 100 MG tablet Take 1 tablet (100 mg total) by mouth daily as needed for erectile dysfunction. 10 tablet 0   No current facility-administered medications for this visit.    REVIEW OF SYSTEMS:    10 Point review of Systems was done is negative except as noted above.   PHYSICAL EXAMINATION: TELEPHONE VISIT   LABORATORY  DATA:  I have reviewed the data as listed  .    Latest Ref Rng & Units 04/02/2023    1:38 PM 01/23/2023   12:47 PM 09/24/2022   10:08 AM  CBC  WBC 4.0 - 10.5 K/uL 6.6  5.4  5.5   Hemoglobin 13.0 - 17.0 g/dL 16.1  09.6  04.5   Hematocrit 39.0 - 52.0 % 43.1  42.7  42.3   Platelets 150 - 400 K/uL 233  229  233.0     .    Latest Ref Rng & Units 01/23/2023   12:47 PM 09/24/2022   10:08 AM 02/18/2022    9:08 AM  CMP  Glucose 70 - 99 mg/dL 88  86    BUN 8 - 23 mg/dL 16  17    Creatinine 4.09 - 1.24 mg/dL 8.11  9.14    Sodium 782 - 145 mmol/L 141  141    Potassium 3.5 - 5.1 mmol/L 3.7  4.1    Chloride 98 - 111 mmol/L 108  105    CO2 22 - 32 mmol/L 28  29    Calcium 8.9 - 10.3 mg/dL 9.9  95.6    Total Protein 6.5 - 8.1 g/dL 7.9  7.6    Total Bilirubin 0.3 - 1.2 mg/dL 0.5  0.9    Alkaline Phos 38 - 126 U/L 51  56    AST 15 - 41 U/L 16  18    ALT 0 - 44 U/L 20  19  22     D-Dimer, Quantitative 01/23/2023:   RADIOGRAPHIC STUDIES: I have personally reviewed the radiological images as listed and agreed with the findings in the report.  No results found. CT CORONARY MORPH W/CTA COR W/SCORE W/CA W/CM &/OR WO/CM (Accession [id number]) (Order  [id number]) Imaging Date: 10/14/2021 Department: [facility] CT IMAGING Released By: Abelina Bachelor Authorizing: Nahser, Deloris Ping, MD   Exam Status  Status  Final [99]   PACS Intelerad Image Link   Show images for CT CORONARY MORPH W/CTA COR W/SCORE W/CA W/CM &/OR WO/CM Addendum  ADDENDUM REPORT: 10/14/2021 13:04   CLINICAL DATA:  This is a 72 year old male with chest pain. Medical history includes hypertension and hyperlipidemia.   EXAM: Cardiac/Coronary  CTA   TECHNIQUE: The patient was scanned on a [employer].   FINDINGS: A 100 kV prospective scan was triggered in the descending thoracic aorta at 111 HU's. Axial non-contrast 3 mm slices were carried out through the heart. The data set was analyzed on a dedicated work station and scored using the Agatson method. Gantry rotation speed was 250 msecs and collimation was .6 mm. No beta blockade and 0.8 mg of sl NTG was given. The 3D data set was reconstructed in 5% intervals of the 67-82 % of the R-R cycle. Diastolic phases were analyzed on a dedicated work station using MPR, MIP and VRT modes. The patient received 80 cc of contrast.   Aorta: Normal size (35 mm).  No calcifications.  No dissection.   Aortic Valve:  Trileaflet.  No calcifications.   Coronary Arteries:  Normal coronary origin.  Right dominance.   RCA is a large dominant artery that gives rise to PDA and PLA. There is minimal (<24%) diffuse calcified plaques in the proximal to mid RCA. In the distal RCA right above the bifurcation there is a minimal calcified plaque.   Left main is a large artery that gives rise to LAD and LCX arteries. There is  mild calcified plaque in the distal left main.   LAD is a large type 3 vessel. The proximal LAD with mild (25-49%) calcified plaques. There is mild calcified plaque in the mid LAD. The mid to distal LAD with diffuse minimal calcified plaques. D1 and D2 as small caliber vessels with  no plaque. D3 is a small caliber vessel with minimal calcification mid vessel.   LCX is a non-dominant artery that gives rise to one large OM1 branch. There is no plaque in the LCX. The OM1 vessel with mild calcified plaque in the proximal portion. The mid and distal portion with no plaques.   Coronary Calcium Score:   Left main: 0   Left anterior descending artery: 467   Left circumflex artery: 116   Right coronary artery: 317   Total: 901   Percentile: 94   Other findings:   Normal pulmonary vein drainage into the left atrium.   Normal left atrial appendage without a thrombus.   Normal size of the pulmonary artery.   IMPRESSION: 1. Coronary calcium score of 901. This was 37 percentile for age and sex matched control.   2. Normal coronary origin with right dominance.   3. No evidence of CAD. CAD-RADS 2. Mild non-obstructive CAD (25-49%). Consider non-atherosclerotic causes of chest pain. Consider preventive therapy and risk factor modification.     Electronically Signed   By: Thomasene Ripple D.O.   On: 10/14/2021 13:04    Addended by Thomasene Ripple, DO on 10/14/2021  1:06 PM    Study Result  Narrative & Impression  EXAM: OVER-READ INTERPRETATION  CT CHEST   The following report is an over-read performed by radiologist Dr. Trudie Reed of [facility] on 10/14/2021. This over-read does not include interpretation of cardiac or coronary anatomy or pathology. The coronary calcium score/coronary CTA interpretation by the cardiologist is attached.   COMPARISON:  Chest CTA 07/08/2021.   FINDINGS: Series 9 images 98-108 demonstrate a filling defect within a subsegmental sized pulmonary arterial branch to the right lower lobe, compatible with pulmonary embolism. Tiny calcified granuloma in the right lower lobe. Within the visualized portions of the thorax there are no suspicious appearing pulmonary nodules or masses, there is no acute consolidative  airspace disease, no pleural effusions, no pneumothorax and no lymphadenopathy. Visualized portions of the upper abdomen are unremarkable. There are no aggressive appearing lytic or blastic lesions noted in the visualized portions of the skeleton.   IMPRESSION: 1. Study is positive for subsegmental sized pulmonary embolus to the right lower lobe.   These results will be called to the ordering clinician or representative by the Radiologist Assistant, and communication documented in the PACS or [employer].   Electronically Signed: By: Trudie Reed M.D.    ASSESSMENT & PLAN:   72 year old male with history of hypertension, dyslipidemia, mild coronary artery disease with  #1 Incidentally noted right lower lobe subsegmental PE in November 2022 This was noted incidentally on CT coronary angiogram.  Patient did not have any clear symptoms attributable to this. Potentially provoked by recent COVID-19 infection 2 to 3 weeks prior to this No Definitive family history of hypercoagulable state or VTE. No personal history of VTE or arterial thrombosis prior to that finding. -No previous venous Doppler ultrasounds of his lower extremities. No previous coagulable testing done. Previously recommended long-term Eliquis by cardiologist but patient wants to try to get off this.  PLAN: -Discussed lab results from 04/02/2023 with the patient. CBC and CMP are stable.  D-dimer test is negative.  -D-dimer test negative, no biochemical evidence of  blood clot formation at this time on ASA. -Continue aspirin 81 mg EC daily.   -Continue to follow-up with PCP.  FOLLOW-UP: RTC with PCP  The total time spent in the appointment was 15 minutes* .  All of the patient's questions were answered with apparent satisfaction. The patient knows to call the clinic with any problems, questions or concerns.   Wyvonnia Lora MD MS AAHIVMS [facility] Hematology/Oncology Physician [facility]  .*Total Encounter Time as defined by the Centers for Medicare and Medicaid Services includes, in addition to the face-to-face time of a patient visit (documented in the note above) non-face-to-face time: obtaining and reviewing outside history, ordering and reviewing medications, tests or procedures, care coordination (communications with other health care professionals or caregivers) and documentation in the medical record.   I, Ok Edwards, am acting as a Neurosurgeon for Wyvonnia Lora, MD.  .I have reviewed the above documentation for accuracy and completeness, and I agree with the above. Johney Maine MD

## 2023-07-15 ENCOUNTER — Telehealth: Payer: Self-pay | Admitting: Internal Medicine

## 2023-07-15 NOTE — Telephone Encounter (Signed)
Contacted Max Perez to schedule their annual wellness visit. Patient declined to schedule AWV at this time.Transferred care.  [facility] Care Guide [facility] AWV TEAM Direct Dial: [phone]

## 2024-10-02 DIAGNOSIS — J189 Pneumonia, unspecified organism: Secondary | ICD-10-CM | POA: Diagnosis not present

## 2024-10-02 DIAGNOSIS — R062 Wheezing: Secondary | ICD-10-CM | POA: Diagnosis not present

## 2024-11-06 DIAGNOSIS — N400 Enlarged prostate without lower urinary tract symptoms: Secondary | ICD-10-CM | POA: Diagnosis not present

## 2024-11-06 DIAGNOSIS — I440 Atrioventricular block, first degree: Secondary | ICD-10-CM | POA: Diagnosis not present

## 2024-11-06 DIAGNOSIS — Z860100 Personal history of colon polyps, unspecified: Secondary | ICD-10-CM | POA: Diagnosis not present

## 2024-11-06 DIAGNOSIS — Z0000 Encounter for general adult medical examination without abnormal findings: Secondary | ICD-10-CM | POA: Diagnosis not present

## 2024-11-06 DIAGNOSIS — E785 Hyperlipidemia, unspecified: Secondary | ICD-10-CM | POA: Diagnosis not present

## 2024-11-06 DIAGNOSIS — I10 Essential (primary) hypertension: Secondary | ICD-10-CM | POA: Diagnosis not present

## 2024-11-06 DIAGNOSIS — R7303 Prediabetes: Secondary | ICD-10-CM | POA: Diagnosis not present

## 2024-12-05 NOTE — Progress Notes (Signed)
 " Cardiology Office Note:   Date:  12/05/2024  ID:  Max Perez, DOB [birth year], MRN [id number] PCP: No primary care provider on file.  [facility] Providers Cardiologist:  Aleene Passe, MD (Inactive) { Chief Complaint: No chief complaint on file.     History of Present Illness:   Max Perez is a 73 y.o. male with a PMH of elevated CAC score (>900), HTN, HLD, prior provoked PE who presents for follow up.  This is my first time meeting the patient today.  Last seen by Dr. Passe 1 year ago.  Was seen by hematology and deemed to not require long-term DOAC.  Currently on aspirin  monotherapy.  Prior CCTA demonstrates markedly elevated CAC score to 901.   Past Medical History:  Diagnosis Date   Chest pain    Elevated blood pressure reading    Elevated BP    Hypercalcemia    Hyperlipidemia    Hyperplastic colonic polyp    Hypertension    PE (pulmonary thromboembolism) (HCC) 10/07/2021     Studies Reviewed:    EKG: ***       Cardiac Studies & Procedures   ______________________________________________________________________________________________          CT SCANS  CT CORONARY MORPH W/CTA COR W/SCORE 10/14/2021  Addendum 10/14/2021  1:06 PM ADDENDUM REPORT: 10/14/2021 13:04  CLINICAL DATA:  This is a 73 year old male with chest pain. Medical history includes hypertension and hyperlipidemia.  EXAM: Cardiac/Coronary  CTA  TECHNIQUE: The patient was scanned on a [employer].  FINDINGS: A 100 kV prospective scan was triggered in the descending thoracic aorta at 111 HU's. Axial non-contrast 3 mm slices were carried out through the heart. The data set was analyzed on a dedicated work station and scored using the Agatson method. Gantry rotation speed was 250 msecs and collimation was .6 mm. No beta blockade and 0.8 mg of sl NTG was given. The 3D data set was reconstructed in 5% intervals of the 67-82 % of the R-R cycle. Diastolic  phases were analyzed on a dedicated work station using MPR, MIP and VRT modes. The patient received 80 cc of contrast.  Aorta: Normal size (35 mm).  No calcifications.  No dissection.  Aortic Valve:  Trileaflet.  No calcifications.  Coronary Arteries:  Normal coronary origin.  Right dominance.  RCA is a large dominant artery that gives rise to PDA and PLA. There is minimal (<24%) diffuse calcified plaques in the proximal to mid RCA. In the distal RCA right above the bifurcation there is a minimal calcified plaque.  Left main is a large artery that gives rise to LAD and LCX arteries. There is mild calcified plaque in the distal left main.  LAD is a large type 3 vessel. The proximal LAD with mild (25-49%) calcified plaques. There is mild calcified plaque in the mid LAD. The mid to distal LAD with diffuse minimal calcified plaques. D1 and D2 as small caliber vessels with no plaque. D3 is a small caliber vessel with minimal calcification mid vessel.  LCX is a non-dominant artery that gives rise to one large OM1 branch. There is no plaque in the LCX. The OM1 vessel with mild calcified plaque in the proximal portion. The mid and distal portion with no plaques.  Coronary Calcium  Score:  Left main: 0  Left anterior descending artery: 467  Left circumflex artery: 116  Right coronary artery: 317  Total: 901  Percentile: 94  Other findings:  Normal pulmonary vein drainage  into the left atrium.  Normal left atrial appendage without a thrombus.  Normal size of the pulmonary artery.  IMPRESSION: 1. Coronary calcium  score of 901. This was 93 percentile for age and sex matched control.  2. Normal coronary origin with right dominance.  3. No evidence of CAD. CAD-RADS 2. Mild non-obstructive CAD (25-49%). Consider non-atherosclerotic causes of chest pain. Consider preventive therapy and risk factor modification.   Electronically Signed By: Kardie  Tobb D.O. On:  10/14/2021 13:04  Narrative EXAM: OVER-READ INTERPRETATION  CT CHEST  The following report is an over-read performed by radiologist Dr. Toribio Aye of [facility] on 10/14/2021. This over-read does not include interpretation of cardiac or coronary anatomy or pathology. The coronary calcium  score/coronary CTA interpretation by the cardiologist is attached.  COMPARISON:  Chest CTA 07/08/2021.  FINDINGS: Series 9 images 98-108 demonstrate a filling defect within a subsegmental sized pulmonary arterial branch to the right lower lobe, compatible with pulmonary embolism. Tiny calcified granuloma in the right lower lobe. Within the visualized portions of the thorax there are no suspicious appearing pulmonary nodules or masses, there is no acute consolidative airspace disease, no pleural effusions, no pneumothorax and no lymphadenopathy. Visualized portions of the upper abdomen are unremarkable. There are no aggressive appearing lytic or blastic lesions noted in the visualized portions of the skeleton.  IMPRESSION: 1. Study is positive for subsegmental sized pulmonary embolus to the right lower lobe.  These results will be called to the ordering clinician or representative by the Radiologist Assistant, and communication documented in the PACS or [employer].  Electronically Signed: By: Toribio Aye M.D. On: 10/14/2021 09:34     ______________________________________________________________________________________________      Risk Assessment/Calculations:   {Does this patient have ATRIAL FIBRILLATION?:[phone]} No BP recorded.  {Refresh Note OR Click here to enter BP  :1}***        Physical Exam:     VS:  There were no vitals taken for this visit. ***    Wt Readings from Last 3 Encounters:  01/23/23 178 lb (80.7 kg)  01/18/23 178 lb (80.7 kg)  09/24/22 182 lb (82.6 kg)     GEN: Well nourished, well developed, in no acute distress NECK: No  JVD; No carotid bruits CARDIAC: ***RRR, no murmurs, rubs, gallops RESPIRATORY:  Clear to auscultation without rales, wheezing or rhonchi  ABDOMEN: Soft, non-tender, non-distended, normal bowel sounds EXTREMITIES:  Warm and well perfused, no edema; No deformity, 2+ radial pulses PSYCH: Normal mood and affect   Assessment & Plan       {Are you ordering a CV Procedure (e.g. stress test, cath, DCCV, TEE, etc)?   Press F2        :[id number]}   This note was written with the assistance of a dictation microphone or AI dictation software. Please excuse any typos or grammatical errors.   Signed, Georganna Archer, MD 12/05/2024 11:09 PM    [facility]  "

## 2024-12-06 ENCOUNTER — Ambulatory Visit: Payer: Medicare (Managed Care) | Admitting: Student in an Organized Health Care Education/Training Program

## 2024-12-06 ENCOUNTER — Encounter: Payer: Self-pay | Admitting: Student in an Organized Health Care Education/Training Program

## 2024-12-06 VITALS — BP 153/84 | HR 63 | Ht 68.0 in | Wt 174.2 lb

## 2024-12-06 DIAGNOSIS — I10 Essential (primary) hypertension: Secondary | ICD-10-CM

## 2024-12-06 DIAGNOSIS — E785 Hyperlipidemia, unspecified: Secondary | ICD-10-CM

## 2024-12-06 DIAGNOSIS — I2510 Atherosclerotic heart disease of native coronary artery without angina pectoris: Secondary | ICD-10-CM | POA: Diagnosis not present

## 2024-12-06 NOTE — Patient Instructions (Addendum)
 Lab Work: LIPID PANEL  LP(a)  If you have labs (blood work) drawn today and your tests are completely normal, you will receive your results only by: MyChart Message (if you have MyChart) OR A paper copy in the mail If you have any lab test that is abnormal or we need to change your treatment, we will call you to review the results.  PLEASE MONITOR BLOOD PRESSURE TWICE A DAY FOR TWO WEEKS AND CONTACT OFFICE TO ADVISE RESULTS  Blood Pressure Record Sheet To take your blood pressure, you will need a blood pressure machine. You can buy a blood pressure machine (blood pressure monitor) at your clinic, drug store, or online. When choosing one, consider: An automatic monitor that has an arm cuff. A cuff that wraps snugly around your upper arm. You should be able to fit only one finger between your arm and the cuff. A device that stores blood pressure reading results. Do not choose a monitor that measures your blood pressure from your wrist or finger. Follow your health care provider's instructions for how to take your blood pressure. To use this form: Take your blood pressure medications every day These measurements should be taken when you have been at rest for at least 10-15 min Take at least 2 readings with each blood pressure check. This makes sure the results are correct. Wait 1-2 minutes between measurements. Write down the results in the spaces on this form. Keep in mind it should always be recorded systolic over diastolic. Both numbers are important.  Repeat this every day for 2-3 weeks, or as told by your health care provider.  Make a follow-up appointment with your health care provider to discuss the results.  Blood Pressure Log Date Medications taken? (Y/N) Blood Pressure Time of Day                                                                                                          Follow-Up: At [facility], you and your health needs are our  priority.  As part of our continuing mission to provide you with exceptional heart care, our providers are all part of one team.  This team includes your primary Cardiologist (physician) and Advanced Practice Providers or APPs (Physician Assistants and Nurse Practitioners) who all work together to provide you with the care you need, when you need it.  Your next appointment:   12 months  Provider:   Georganna Archer, MD

## 2024-12-06 NOTE — Assessment & Plan Note (Signed)
-   BP is elevated today in clinic, but the patient says that when he checks his blood pressure once a week at home it is perfectly controlled in the 1 teens to 120s. -I will have him do a 2-week blood pressure log to verify. Continue losartan  100 mg daily 2-week blood pressure log Follow-up in 12 months

## 2024-12-06 NOTE — Assessment & Plan Note (Signed)
-   Due for lipid check. Lipid panel, lipoprotein a Continue Crestor  10 mg daily

## 2024-12-06 NOTE — Assessment & Plan Note (Signed)
-   Severely elevated CAC score of 901. -No chest pain or concerning symptoms. -Will continue secondary prevention. Continue aspirin  81 mg daily Continue statin as below

## 2024-12-07 LAB — LIPID PANEL
Chol/HDL Ratio: 2.7 ratio (ref 0.0–5.0)
Cholesterol, Total: 119 mg/dL (ref 100–199)
HDL: 44 mg/dL
LDL Chol Calc (NIH): 57 mg/dL (ref 0–99)
Triglycerides: 95 mg/dL (ref 0–149)
VLDL Cholesterol Cal: 18 mg/dL (ref 5–40)

## 2024-12-07 LAB — LIPOPROTEIN A (LPA): Lipoprotein (a): 18.7 nmol/L

## 2024-12-11 ENCOUNTER — Ambulatory Visit: Payer: Self-pay | Admitting: Student in an Organized Health Care Education/Training Program
